# Patient Record
Sex: Female | Born: 1975 | Race: White | Hispanic: No | Marital: Married | Smoking: Never smoker
Health system: Southern US, Community
[De-identification: ages and names within clinical notes are randomized; demographics above are authoritative.]

## PROBLEM LIST (undated history)

## (undated) ENCOUNTER — Inpatient Hospital Stay (HOSPITAL_COMMUNITY): Payer: Self-pay

## (undated) DIAGNOSIS — T7840XA Allergy, unspecified, initial encounter: Secondary | ICD-10-CM

## (undated) DIAGNOSIS — Z880 Allergy status to penicillin: Secondary | ICD-10-CM

## (undated) DIAGNOSIS — E785 Hyperlipidemia, unspecified: Secondary | ICD-10-CM

## (undated) DIAGNOSIS — D649 Anemia, unspecified: Secondary | ICD-10-CM

## (undated) DIAGNOSIS — M6289 Other specified disorders of muscle: Secondary | ICD-10-CM

## (undated) DIAGNOSIS — Z3169 Encounter for other general counseling and advice on procreation: Secondary | ICD-10-CM

## (undated) DIAGNOSIS — Z87898 Personal history of other specified conditions: Secondary | ICD-10-CM

## (undated) HISTORY — DX: Anemia, unspecified: D64.9

## (undated) HISTORY — DX: Personal history of other specified conditions: Z87.898

## (undated) HISTORY — DX: Allergy, unspecified, initial encounter: T78.40XA

## (undated) HISTORY — DX: Encounter for other general counseling and advice on procreation: Z31.69

## (undated) HISTORY — PX: TYMPANOSTOMY TUBE PLACEMENT: SHX32

## (undated) HISTORY — DX: Other specified disorders of muscle: M62.89

## (undated) HISTORY — DX: Allergy status to penicillin: Z88.0

## (undated) HISTORY — DX: Hyperlipidemia, unspecified: E78.5

## (undated) HISTORY — PX: SPINE SURGERY: SHX786

## (undated) HISTORY — PX: WISDOM TOOTH EXTRACTION: SHX21

---

## 2009-08-14 ENCOUNTER — Encounter: Payer: Self-pay | Admitting: Internal Medicine

## 2009-08-14 LAB — CONVERTED CEMR LAB: Pap Smear: NORMAL

## 2009-11-23 ENCOUNTER — Encounter: Admission: RE | Admit: 2009-11-23 | Discharge: 2009-11-23 | Payer: Self-pay | Admitting: Obstetrics and Gynecology

## 2010-03-17 ENCOUNTER — Inpatient Hospital Stay (HOSPITAL_COMMUNITY): Admission: AD | Admit: 2010-03-17 | Payer: Self-pay | Source: Home / Self Care | Admitting: Obstetrics and Gynecology

## 2010-03-25 ENCOUNTER — Inpatient Hospital Stay (HOSPITAL_COMMUNITY)
Admission: AD | Admit: 2010-03-25 | Discharge: 2010-03-29 | DRG: 372 | Disposition: A | Discharge status: Home / Self Care | Payer: BC Managed Care – PPO | Source: Ambulatory Visit | Attending: Obstetrics and Gynecology | Admitting: Obstetrics and Gynecology

## 2010-03-25 DIAGNOSIS — O9902 Anemia complicating childbirth: Secondary | ICD-10-CM | POA: Diagnosis not present

## 2010-03-25 DIAGNOSIS — D696 Thrombocytopenia, unspecified: Secondary | ICD-10-CM | POA: Diagnosis not present

## 2010-03-25 DIAGNOSIS — D689 Coagulation defect, unspecified: Secondary | ICD-10-CM | POA: Diagnosis not present

## 2010-03-25 DIAGNOSIS — D649 Anemia, unspecified: Secondary | ICD-10-CM | POA: Diagnosis not present

## 2010-03-25 LAB — CBC
HCT: 40.8 % (ref 36.0–46.0)
Hemoglobin: 13.8 g/dL (ref 12.0–15.0)
MCH: 30.5 pg (ref 26.0–34.0)
MCHC: 33.8 g/dL (ref 30.0–36.0)
MCV: 90.3 fL (ref 78.0–100.0)
Platelets: 113 10*3/uL — ABNORMAL LOW (ref 150–400)
RBC: 4.52 MIL/uL (ref 3.87–5.11)
RDW: 13.3 % (ref 11.5–15.5)
WBC: 8.4 10*3/uL (ref 4.0–10.5)

## 2010-03-26 LAB — CBC
HCT: 42.4 % (ref 36.0–46.0)
Hemoglobin: 14.4 g/dL (ref 12.0–15.0)
MCH: 30.8 pg (ref 26.0–34.0)
MCHC: 34 g/dL (ref 30.0–36.0)
Platelets: 114 10*3/uL — ABNORMAL LOW (ref 150–400)
RBC: 4.67 MIL/uL (ref 3.87–5.11)
RDW: 13.5 % (ref 11.5–15.5)
WBC: 11.9 10*3/uL — ABNORMAL HIGH (ref 4.0–10.5)

## 2010-03-27 LAB — CBC
HCT: 28.8 % — ABNORMAL LOW (ref 36.0–46.0)
Hemoglobin: 9.8 g/dL — ABNORMAL LOW (ref 12.0–15.0)
MCH: 30.5 pg (ref 26.0–34.0)
MCHC: 34 g/dL (ref 30.0–36.0)
MCV: 89.7 fL (ref 78.0–100.0)
Platelets: 64 10*3/uL — ABNORMAL LOW (ref 150–400)
RDW: 13.5 % (ref 11.5–15.5)

## 2010-03-28 LAB — CBC
HCT: 23.6 % — ABNORMAL LOW (ref 36.0–46.0)
HCT: 23.8 % — ABNORMAL LOW (ref 36.0–46.0)
Hemoglobin: 7.8 g/dL — ABNORMAL LOW (ref 12.0–15.0)
Hemoglobin: 8 g/dL — ABNORMAL LOW (ref 12.0–15.0)
MCH: 30.2 pg (ref 26.0–34.0)
MCH: 30.7 pg (ref 26.0–34.0)
MCHC: 33.1 g/dL (ref 30.0–36.0)
MCHC: 33.6 g/dL (ref 30.0–36.0)
MCV: 91.5 fL (ref 78.0–100.0)
MCV: 91.2 fL (ref 78.0–100.0)
Platelets: 59 10*3/uL — ABNORMAL LOW (ref 150–400)
RBC: 2.61 MIL/uL — ABNORMAL LOW (ref 3.87–5.11)
RDW: 13.8 % (ref 11.5–15.5)
RDW: 13.7 % (ref 11.5–15.5)

## 2010-03-28 LAB — URIC ACID: Uric Acid, Serum: 3.9 mg/dL (ref 2.4–7.0)

## 2010-03-28 LAB — COMPREHENSIVE METABOLIC PANEL
ALT: 15 U/L (ref 0–35)
AST: 21 U/L (ref 0–37)
Alkaline Phosphatase: 74 U/L (ref 39–117)
Calcium: 8.1 mg/dL — ABNORMAL LOW (ref 8.4–10.5)
Chloride: 107 mEq/L (ref 96–112)
Creatinine, Ser: 0.66 mg/dL (ref 0.4–1.2)
GFR calc Af Amer: >60 mL/min (ref >60)
GFR calc non Af Amer: >60 mL/min (ref >60)
Glucose, Bld: 86 mg/dL (ref 70–99)
Potassium: 4.1 mEq/L (ref 3.5–5.1)
Sodium: 138 mEq/L (ref 135–145)
Total Bilirubin: 0.1 mg/dL — ABNORMAL LOW (ref 0.3–1.2)
Total Protein: 4.1 g/dL — ABNORMAL LOW (ref 6.0–8.3)

## 2010-03-28 LAB — APTT: aPTT: 26 seconds (ref 24–37)

## 2010-03-28 LAB — PROTIME-INR
INR: 0.92 (ref 0.00–1.49)
Prothrombin Time: 12.6 seconds (ref 11.6–15.2)

## 2010-03-28 LAB — LACTATE DEHYDROGENASE: LDH: 164 U/L (ref 94–250)

## 2010-03-29 LAB — CBC
Hemoglobin: 8 g/dL — ABNORMAL LOW (ref 12.0–15.0)
MCH: 30.7 pg (ref 26.0–34.0)
MCV: 91.6 fL (ref 78.0–100.0)
Platelets: 85 10*3/uL — ABNORMAL LOW (ref 150–400)
RBC: 2.61 MIL/uL — ABNORMAL LOW (ref 3.87–5.11)
RDW: 13.9 % (ref 11.5–15.5)
WBC: 7.5 10*3/uL (ref 4.0–10.5)

## 2010-04-16 ENCOUNTER — Other Ambulatory Visit: Payer: Self-pay | Admitting: Internal Medicine

## 2010-04-16 ENCOUNTER — Encounter: Payer: Self-pay | Admitting: Internal Medicine

## 2010-04-16 ENCOUNTER — Other Ambulatory Visit: Payer: BC Managed Care – PPO

## 2010-04-16 ENCOUNTER — Ambulatory Visit (INDEPENDENT_AMBULATORY_CARE_PROVIDER_SITE_OTHER): Payer: BC Managed Care – PPO | Admitting: Internal Medicine

## 2010-04-16 DIAGNOSIS — Z0000 Encounter for general adult medical examination without abnormal findings: Secondary | ICD-10-CM

## 2010-04-16 DIAGNOSIS — Z23 Encounter for immunization: Secondary | ICD-10-CM

## 2010-04-16 DIAGNOSIS — E785 Hyperlipidemia, unspecified: Secondary | ICD-10-CM

## 2010-04-16 LAB — CBC WITH DIFFERENTIAL/PLATELET
Basophils Absolute: 0.1 10*3/uL (ref 0.0–0.1)
Basophils Relative: 1.5 % (ref 0.0–3.0)
Eosinophils Absolute: 0.2 10*3/uL (ref 0.0–0.7)
HCT: 37.4 % (ref 36.0–46.0)
Hemoglobin: 12.7 g/dL (ref 12.0–15.0)
Lymphocytes Relative: 19.7 % (ref 12.0–46.0)
MCHC: 33.9 g/dL (ref 30.0–36.0)
MCV: 92.6 fl (ref 78.0–100.0)
Monocytes Absolute: 0.3 10*3/uL (ref 0.1–1.0)
Monocytes Relative: 5.9 % (ref 3.0–12.0)
Neutro Abs: 3.2 10*3/uL (ref 1.4–7.7)
Neutrophils Relative %: 69.1 % (ref 43.0–77.0)
RBC: 4.04 Mil/uL (ref 3.87–5.11)

## 2010-04-16 LAB — HEPATIC FUNCTION PANEL
ALT: 18 U/L (ref 0–35)
AST: 21 U/L (ref 0–37)
Albumin: 3.8 g/dL (ref 3.5–5.2)
Alkaline Phosphatase: 88 U/L (ref 39–117)
Bilirubin, Direct: 0.1 mg/dL (ref 0.0–0.3)
Total Bilirubin: 0.6 mg/dL (ref 0.3–1.2)
Total Protein: 6.4 g/dL (ref 6.0–8.3)

## 2010-04-16 LAB — BASIC METABOLIC PANEL
CO2: 28 mEq/L (ref 19–32)
Calcium: 9.5 mg/dL (ref 8.4–10.5)
Chloride: 107 mEq/L (ref 96–112)
Creatinine, Ser: 1 mg/dL (ref 0.4–1.2)
GFR: 71.46 mL/min (ref >60.00)
Glucose, Bld: 81 mg/dL (ref 70–99)
Potassium: 4.3 mEq/L (ref 3.5–5.1)
Sodium: 140 mEq/L (ref 135–145)

## 2010-04-16 LAB — URINALYSIS, ROUTINE W REFLEX MICROSCOPIC
Bilirubin Urine: NEGATIVE
Ketones, ur: NEGATIVE
Nitrite: NEGATIVE
Specific Gravity, Urine: >=1.03 (ref 1.000–1.030)
Total Protein, Urine: NEGATIVE
Urine Glucose: NEGATIVE
pH: 5 (ref 5.0–8.0)

## 2010-04-16 LAB — LIPID PANEL
Cholesterol: 217 mg/dL — ABNORMAL HIGH (ref 0–200)
HDL: 52.6 mg/dL (ref >39.00)
Triglycerides: 101 mg/dL (ref 0.0–149.0)

## 2010-04-16 LAB — LDL CHOLESTEROL, DIRECT: Direct LDL: 151.1 mg/dL

## 2010-04-16 LAB — TSH: TSH: 0.7 u[IU]/mL (ref 0.35–5.50)

## 2010-04-22 NOTE — Assessment & Plan Note (Signed)
Summary: NEW [employer] PT--PKG--#--STC   Vital Signs:  Patient profile:   35 year old female Height:      66 inches Weight:      150 pounds BMI:     24.30 O2 Sat:      98 % on Room air Temp:     98.4 degrees F oral Pulse rate:   62 / minute BP sitting:   100 / 62  (left arm) Cuff size:   regular  Vitals Entered By: Zella Ball Ewing CMA Duncan Dull) (April 16, 2010 9:33 AM)  O2 Flow:  Room air  Preventive Care Screening  Last Tetanus Booster:    Date:  04/15/1994    Results:  Historical   Pap Smear:    Date:  08/14/2009    Results:  normal   CC: New Patient/RE   CC:  New Patient/RE.  History of Present Illness: here to establish with wellness, 3 wks postpartum with recent vaginal delivery complicated by significant anemia with f/u per GYN with anemia and plt improved, iron improved,  Pt denies CP, worsening sob, doe, wheezing, orthopnea, pnd, worsening LE edema, palps, dizziness or syncope  Pt denies new neuro symptoms such as headache, facial or extremity weakness Pt denies polydipsia, polyuria.  Overall good compliance with meds, trying to follow low chol diet, wt stable, little excercise however  No fever, wt loss, night sweats, loss of appetite or other constitutional symptoms  Overall good compliance with meds, and good tolerability.  Denies worsening depressive symptoms, suicidal ideation, or panic.  Pt states good ability with ADL's, low fall risk, home safety reviewed and adequate, no significant change in hearing or vision, trying to follow lower chol diet, and occasionally active only with regular excercise.  No new complaints, needs form filled out for work to  certify she was examined and VS/labs today  Preventive Screening-Counseling & Management  Alcohol-Tobacco     Smoking Status: never      Drug Use:  no.    Problems Prior to Update: 1)  Preventive Health Care  (ICD-V70.0)  Medications Prior to Update: 1)  None  Current Medications (verified): 1)  Prenatal Vitamins  0.8 Mg Tabs (Prenatal Multivit-Min-Fe-Fa) .Marland Kitchen.. 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         all otherwise negative per pt -    Physical Exam  General:  alert and overweight-appearing.   Head:  normocephalic and  atraumatic.   Eyes:  vision grossly intact, pupils equal, and pupils round.   Ears:  R ear normal and L ear normal.   Nose:  no external deformity and no nasal discharge.   Mouth:  no gingival abnormalities and pharynx pink and moist.   Neck:  supple and no masses.   Lungs:  normal respiratory effort and normal breath sounds.   Heart:  normal rate and regular rhythm.   Abdomen:  soft, non-tender, and normal bowel sounds.   Msk:   no joint tenderness and no joint swelling.   Extremities:  no edema, no erythema  Neurologic:  cranial nerves II-XII intact and strength normal in all extremities.   Skin:  color normal and no rashes.   Psych:  not anxious appearing and not depressed appearing.     Impression & Recommendations:  Problem # 1:  Preventive Health Care (ICD-V70.0) Overall doing well, age appropriate education and counseling updated, referral for preventive services and immunizations addressed, dietary counseling and smoking status adressed , most recent labs reviewed I have personally reviewed and have noted 1.The patient's medical and social history 2.Their use of alcohol, tobacco or illicit drugs 3.Their current medications and supplements 4. Functional ability including ADL's, fall risk, home safety risk, hearing & visual impairment  5.Diet and physical activities 6.Evidence for depression or mood disorders The patients weight, height, BMI  have been recorded in the chart I have made referrals, counseling and provided education to the patient based review of the above  Orders: TLB-BMP (Basic Metabolic Panel-BMET) (80048-METABOL) TLB-CBC Platelet - w/Differential (85025-CBCD) TLB-Hepatic/Liver Function Pnl (80076-HEPATIC) TLB-Lipid Panel (80061-LIPID) TLB-TSH (Thyroid Stimulating Hormone) (84443-TSH) TLB-Udip ONLY (81003-UDIP)  Complete Medication List: 1)  Prenatal Vitamins 0.8 Mg Tabs (Prenatal multivit-min-fe-fa) .Marland Kitchen.. 1 by mouth once daily  Other Orders: Tdap => 74yrs IM (16109) Admin 1st Vaccine (60454)  Patient Instructions: 1)  you had the tetanus shot today 2)  Please go to the Lab in the basement for your blood and/or urine tests today 3)  Please call the number on the [facility] Card for results of your testing 4)  Your form will be filled out and faxed 5)  Please schedule a follow-up appointment in 1 year or sooner if needed   Orders Added: 1)  Tdap => 15yrs IM [90715] 2)  Admin 1st  Vaccine [90471] 3)  TLB-BMP (Basic Metabolic Panel-BMET) [80048-METABOL] 4)  TLB-CBC Platelet - w/Differential [85025-CBCD] 5)  TLB-Hepatic/Liver Function Pnl [80076-HEPATIC] 6)  TLB-Lipid Panel [80061-LIPID] 7)  TLB-TSH (Thyroid Stimulating Hormone) [84443-TSH] 8)  TLB-Udip ONLY [81003-UDIP] 9)  New Patient 18-39 years [99385]   Immunizations Administered:  Tetanus Vaccine:    Vaccine Type: Tdap    Site: left deltoid    Mfr: Evans    Dose: 0.5 ml    Route: IM    Given by: Zella Ball Ewing CMA (AAMA)    Exp. Date: 03/18/2011    Lot #: [id number]    VIS given: 01/02/08 version given April 16, 2010.   Immunizations Administered:  Tetanus Vaccine:    Vaccine Type: Tdap    Site: left deltoid    Mfr: Evans    Dose: 0.5 ml    Route: IM    Given by: Zella Ball Ewing CMA (AAMA)    Exp. Date: 03/18/2011    Lot #: [id number]    VIS given: 01/02/08 version given April 16, 2010.

## 2010-04-27 NOTE — Letter (Signed)
Summary: Health Screening / [facility]  Health Screening / [facility]   Imported By: Lennie Odor 04/19/2010 11:39:41  _____________________________________________________________________  External Attachment:    Type:   Image     Comment:   External Document

## 2010-05-04 DIAGNOSIS — M6289 Other specified disorders of muscle: Secondary | ICD-10-CM

## 2010-05-04 HISTORY — DX: Other specified disorders of muscle: M62.89

## 2011-06-03 ENCOUNTER — Encounter: Payer: Self-pay | Admitting: Internal Medicine

## 2011-06-03 ENCOUNTER — Telehealth: Payer: Self-pay | Admitting: *Deleted

## 2011-06-03 ENCOUNTER — Other Ambulatory Visit (INDEPENDENT_AMBULATORY_CARE_PROVIDER_SITE_OTHER): Payer: BC Managed Care – PPO

## 2011-06-03 DIAGNOSIS — Z0000 Encounter for general adult medical examination without abnormal findings: Secondary | ICD-10-CM

## 2011-06-03 DIAGNOSIS — E785 Hyperlipidemia, unspecified: Secondary | ICD-10-CM

## 2011-06-03 HISTORY — DX: Hyperlipidemia, unspecified: E78.5

## 2011-06-03 LAB — HEPATIC FUNCTION PANEL
ALT: 15 U/L (ref 0–35)
AST: 19 U/L (ref 0–37)
Albumin: 4.3 g/dL (ref 3.5–5.2)
Alkaline Phosphatase: 67 U/L (ref 39–117)
Bilirubin, Direct: 0.1 mg/dL (ref 0.0–0.3)
Total Bilirubin: 0.9 mg/dL (ref 0.3–1.2)
Total Protein: 7.2 g/dL (ref 6.0–8.3)

## 2011-06-03 LAB — BASIC METABOLIC PANEL
BUN: 16 mg/dL (ref 6–23)
CO2: 28 mEq/L (ref 19–32)
Calcium: 9.2 mg/dL (ref 8.4–10.5)
Chloride: 103 mEq/L (ref 96–112)
GFR: 99.34 mL/min (ref >60.00)
Glucose, Bld: 99 mg/dL (ref 70–99)
Potassium: 4.3 mEq/L (ref 3.5–5.1)
Sodium: 138 mEq/L (ref 135–145)

## 2011-06-03 LAB — URINALYSIS, ROUTINE W REFLEX MICROSCOPIC
Bilirubin Urine: NEGATIVE
Ketones, ur: NEGATIVE
Nitrite: NEGATIVE
Specific Gravity, Urine: <=1.005 (ref 1.000–1.030)
Total Protein, Urine: NEGATIVE
Urine Glucose: NEGATIVE
Urobilinogen, UA: 0.2 (ref 0.0–1.0)
pH: 6.5 (ref 5.0–8.0)

## 2011-06-03 LAB — CBC WITH DIFFERENTIAL/PLATELET
Basophils Absolute: 0 10*3/uL (ref 0.0–0.1)
Eosinophils Absolute: 0.1 10*3/uL (ref 0.0–0.7)
Eosinophils Relative: 1 % (ref 0.0–5.0)
HCT: 43.3 % (ref 36.0–46.0)
Hemoglobin: 14.4 g/dL (ref 12.0–15.0)
Lymphocytes Relative: 18.3 % (ref 12.0–46.0)
Lymphs Abs: 1.1 10*3/uL (ref 0.7–4.0)
Monocytes Relative: 7.9 % (ref 3.0–12.0)
Neutro Abs: 4.5 10*3/uL (ref 1.4–7.7)
Neutrophils Relative %: 72.3 % (ref 43.0–77.0)
Platelets: 217 10*3/uL (ref 150.0–400.0)
RBC: 4.68 Mil/uL (ref 3.87–5.11)
RDW: 13 % (ref 11.5–14.6)
WBC: 6.3 10*3/uL (ref 4.5–10.5)

## 2011-06-03 LAB — LIPID PANEL
HDL: 53.4 mg/dL (ref >39.00)
LDL Cholesterol: 117 mg/dL — ABNORMAL HIGH (ref 0–99)
Total CHOL/HDL Ratio: 3
Triglycerides: 33 mg/dL (ref 0.0–149.0)
VLDL: 6.6 mg/dL (ref 0.0–40.0)

## 2011-06-03 LAB — TSH: TSH: 1.11 u[IU]/mL (ref 0.35–5.50)

## 2011-06-03 NOTE — Telephone Encounter (Signed)
Received  All from anita in the lab. Pt is in lab no orders in computer. Pt is coming in for cpx 06/08/11. entering cpx labs... 06/03/11@10 :18am/LMB

## 2011-06-07 ENCOUNTER — Encounter: Payer: Self-pay | Admitting: Internal Medicine

## 2011-06-07 ENCOUNTER — Ambulatory Visit (INDEPENDENT_AMBULATORY_CARE_PROVIDER_SITE_OTHER): Payer: BC Managed Care – PPO | Admitting: Internal Medicine

## 2011-06-07 VITALS — BP 100/80 | HR 64 | Temp 97.7°F | Ht 66.0 in | Wt 117.1 lb

## 2011-06-07 DIAGNOSIS — D649 Anemia, unspecified: Secondary | ICD-10-CM

## 2011-06-07 DIAGNOSIS — Z0000 Encounter for general adult medical examination without abnormal findings: Secondary | ICD-10-CM

## 2011-06-07 HISTORY — DX: Anemia, unspecified: D64.9

## 2011-06-07 NOTE — Patient Instructions (Signed)
Continue all other medications as before You are given the form filled out today Please continue your good health habits with regular exercise, lower cholesterol diet, weight control Please return in 1 year for your yearly visit, or sooner if needed, with Lab testing done 3-5 days before

## 2011-06-07 NOTE — Progress Notes (Signed)
Subjective:    Patient ID: Carolyn Hancock, female    DOB: 1975-04-12, 36 y.o.   MRN: [id number]  HPI  Here for wellness and f/u;  Overall doing ok;  Pt denies CP, worsening SOB, DOE, wheezing, orthopnea, PND, worsening LE edema, palpitations, dizziness or syncope.  Pt denies neurological change such as new Headache, facial or extremity weakness.  Pt denies polydipsia, polyuria, or low sugar symptoms. Pt states overall good compliance with treatment and medications, good tolerability, and trying to follow lower cholesterol diet.  Pt denies worsening depressive symptoms, suicidal ideation or panic. No fever, wt loss, night sweats, loss of appetite, or other constitutional symptoms.  Pt states good ability with ADL's, low fall risk, home safety reviewed and adequate, no significant changes in hearing or vision, and occasionally active with exercise.  Needs form filled out for work to get a lower cost insurance premium. No acute complaints.  Did have severe anemia with childbirth last yr, but now resolved. Past Medical History  Diagnosis Date  . Hyperlipidemia 06/03/2011  . Anemia, unspecified 06/07/2011   Past Surgical History  Procedure Date  . Tympanostomy tube placement     reports that she has never smoked. She has never used smokeless tobacco. She reports that she does not drink alcohol or use illicit drugs. family history includes Heart failure in her paternal grandmother; Hyperlipidemia in her father and mother; and Hypertension in her father and mother. No Known Allergies No current outpatient prescriptions on file prior to visit.   Review of Systems Review of Systems  Constitutional: Negative for diaphoresis, activity change, appetite change and unexpected weight change.  HENT: Negative for hearing loss, ear pain, facial swelling, mouth sores and neck stiffness.   Eyes: Negative for pain, redness and visual disturbance.  Respiratory: Negative for shortness of breath and wheezing.     Cardiovascular: Negative for chest pain and palpitations.  Gastrointestinal: Negative for diarrhea, blood in stool, abdominal distention and rectal pain.  Genitourinary: Negative for hematuria, flank pain and decreased urine volume.  Musculoskeletal: Negative for myalgias and joint swelling.  Skin: Negative for color change and wound.  Neurological: Negative for syncope and numbness.  Hematological: Negative for adenopathy.  Psychiatric/Behavioral: Negative for hallucinations, self-injury, decreased concentration and agitation.     Objective:   Physical Exam BP 100/80  Pulse 64  Temp(Src) 97.7 F (36.5 C) (Oral)  Ht 5\' 6"  (1.676 m)  Wt 117 lb 2 oz (53.128 kg)  BMI 18.90 kg/m2  SpO2 96%  LMP 05/30/2011 Physical Exam  VS noted Constitutional: Pt is oriented to person, place, and time. Appears well-developed and well-nourished.  HENT:  Head: Normocephalic and atraumatic.  Right Ear: External ear normal.  Left Ear: External ear normal.  Nose: Nose normal.  Mouth/Throat: Oropharynx is clear and moist.  Eyes: Conjunctivae and EOM are normal. Pupils are equal, round, and reactive to light.  Neck: Normal range of motion. Neck supple. No JVD present. No tracheal deviation present.  Cardiovascular: Normal rate, regular rhythm, normal heart sounds and intact distal pulses.   Pulmonary/Chest: Effort normal and breath sounds normal.  Abdominal: Soft. Bowel sounds are normal. There is no tenderness.  Musculoskeletal: Normal range of motion. Exhibits no edema.  Lymphadenopathy:  Has no cervical adenopathy.  Neurological: Pt is alert and oriented to person, place, and time. Pt has normal reflexes. No cranial nerve deficit.  Skin: Skin is warm and dry. No rash noted.  Psychiatric:  Has  normal mood and affect. Behavior is normal.  Assessment & Plan:

## 2011-06-07 NOTE — Assessment & Plan Note (Addendum)
Overall doing well, age appropriate education and counseling updated, referrals for preventative services and immunizations addressed, dietary and smoking counseling addressed, most recent labs and ECG reviewed.  I have personally reviewed and have noted: 1) the patient's medical and social history 2) The pt's use of alcohol, tobacco, and illicit drugs 3) The patient's current medications and supplements 4) Functional ability including ADL's, fall risk, home safety risk, hearing and visual impairment 5) Diet and physical activities 6) Evidence for depression or mood disorder 7) The patient's height, weight, and BMI have been recorded in the chart I have made referrals, and provided counseling and education based on review of the above Overall excellent health, form filled out

## 2011-07-22 ENCOUNTER — Ambulatory Visit (INDEPENDENT_AMBULATORY_CARE_PROVIDER_SITE_OTHER): Payer: BC Managed Care – PPO

## 2011-07-22 VITALS — BP 102/56 | Ht 65.6 in | Wt 119.0 lb

## 2011-07-22 DIAGNOSIS — Z87898 Personal history of other specified conditions: Secondary | ICD-10-CM

## 2011-07-22 DIAGNOSIS — T360X5A Adverse effect of penicillins, initial encounter: Secondary | ICD-10-CM

## 2011-07-22 DIAGNOSIS — Z8619 Personal history of other infectious and parasitic diseases: Secondary | ICD-10-CM

## 2011-07-22 DIAGNOSIS — Z880 Allergy status to penicillin: Secondary | ICD-10-CM

## 2011-07-22 DIAGNOSIS — Z888 Allergy status to other drugs, medicaments and biological substances status: Secondary | ICD-10-CM

## 2011-07-22 DIAGNOSIS — N8184 Pelvic muscle wasting: Secondary | ICD-10-CM

## 2011-07-22 DIAGNOSIS — Z3169 Encounter for other general counseling and advice on procreation: Secondary | ICD-10-CM

## 2011-07-22 DIAGNOSIS — M6289 Other specified disorders of muscle: Secondary | ICD-10-CM

## 2011-07-22 DIAGNOSIS — Z124 Encounter for screening for malignant neoplasm of cervix: Secondary | ICD-10-CM

## 2011-07-22 DIAGNOSIS — Z8742 Personal history of other diseases of the female genital tract: Secondary | ICD-10-CM

## 2011-07-22 HISTORY — DX: Encounter for other general counseling and advice on procreation: Z31.69

## 2011-07-22 MED ORDER — CITRANATAL ASSURE 35-1 & 300 MG PO MISC: 1.0000 | Freq: Every day | ORAL | Status: AC

## 2011-07-22 NOTE — Progress Notes (Signed)
The patient reports:normal menses, no abnormal bleeding, pelvic pain or discharge  Contraception:condoms  Last mammogram: patient has never had a mammogram Last pap: was normal 2012  GC/Chlamydia cultures offered: declined HIV/RPR/HbsAg offered:  declined HSV 1 and 2 glycoprotein offered: declined  Menstrual cycle regular and monthly: Yes Menstrual flow normal: Yes  Urinary symptoms: none Normal bowel movements: Yes Reports abuse at home: No:

## 2011-07-26 LAB — PAP IG W/ RFLX HPV ASCU

## 2011-07-28 DIAGNOSIS — Z87898 Personal history of other specified conditions: Secondary | ICD-10-CM

## 2011-07-28 DIAGNOSIS — Z8619 Personal history of other infectious and parasitic diseases: Secondary | ICD-10-CM | POA: Insufficient documentation

## 2011-07-28 DIAGNOSIS — Z880 Allergy status to penicillin: Secondary | ICD-10-CM

## 2011-07-28 HISTORY — DX: Allergy status to penicillin: Z88.0

## 2011-07-28 HISTORY — DX: Personal history of other specified conditions: Z87.898

## 2011-07-28 NOTE — Progress Notes (Signed)
..   Subjective:    Carolyn Hancock is a 36 y.o. MW female, P1001 s/p SVD 03/26/10., who presents for an annual exam.    Works from home; has a Social worker.  Planning conception last summer/fall. No recent issues w/ Urin. IC; last visit at [facility] almost 37yr ago, and pt reports doing well. History   Social History  . Marital Status: Unknown    Spouse Name: Alycia Rossetti    Number of Children: 1  . Years of Education: 17   Occupational History  .     Social History Main Topics  . Smoking status: Never Smoker   . Smokeless tobacco: Never Used  . Alcohol Use: No  . Drug Use: No  . Sexually Active: Yes    Birth Control/ Protection: Condom   Other Topics Concern  . None   Social History Narrative  . None  The patient reports:normal menses, no abnormal bleeding, pelvic pain or discharge Contraception:condoms  Last mammogram: patient has never had a mammogram Last pap: was normal 2012  GC/Chlamydia cultures offered: declined  HIV/RPR/HbsAg offered: declined  HSV 1 and 2 glycoprotein offered: declined  Menstrual cycle regular and monthly: Yes; has had 3-4 normal cycles s/p lactational amenorrhea. Menstrual flow normal: Yes  Urinary symptoms: none  Normal bowel movements: Yes  Reports abuse at home: No:    Menstrual cycle:   LMP: Patient's last menstrual period was 06/27/2011.           Cycle: monthly  The following portions of the patient's history were reviewed and updated as appropriate: allergies, current medications, past family history, past medical history, past social history, past surgical history and problem list.  Review of Systems Pertinent items are noted in HPI. Breast:Negative for breast lump,nipple discharge or nipple retraction Gastrointestinal: Negative for abdominal pain, change in bowel habits or rectal bleeding Urinary:negative   Objective:    BP 102/56  Ht 5' 5.6" (1.666 m)  Wt 119 lb (53.978 kg)  BMI 19.44 kg/m2  LMP 06/27/2011    Weight:  Wt  Readings from Last 1 Encounters:  07/22/11 119 lb (53.978 kg)          BMI: Body mass index is 19.44 kg/(m^2).  General Appearance: Alert, appropriate appearance for age. No acute distress HEENT: Grossly normal Neck / Thyroid: Supple, no masses, nodes or enlargement Lungs: clear to auscultation bilaterally Back: No CVA tenderness Breast Exam: No dimpling, nipple retraction or discharge. No masses or nodes. and No masses or nodes.No dimpling, nipple retraction or discharge. Cardiovascular: Regular rate and rhythm. S1, S2, no murmur Gastrointestinal: Soft, non-tender, no masses or organomegaly Pelvic Exam: Vulva and vagina appear normal. Bimanual exam reveals normal uterus and adnexa. 4/5 Kegel Rectovaginal: not indicated Lymphatic Exam: Non-palpable nodes in neck, clavicular, axillary, or inguinal regions Skin: no rash or abnormalities Neurologic: Normal gait and speech, no tremor  Psychiatric: Alert and oriented, appropriate affect.   Wet Prep:not applicable Urinalysis:not applicable UPT: Not done   Assessment:    Normal gyn exam Preconception counseling; AMA; resolved PFD.    Plan:   Rec'd daily vit, sunscreen, Kegels, healthy lifestyle. pap smear return annually or prn STD screening: declined Contraception:condoms      Elden Brucato HCNM

## 2011-10-28 ENCOUNTER — Inpatient Hospital Stay (HOSPITAL_COMMUNITY)
Admission: AD | Admit: 2011-10-28 | Discharge: 2011-10-28 | Disposition: A | Discharge status: Home / Self Care | Payer: BC Managed Care – PPO | Source: Ambulatory Visit | Attending: Obstetrics and Gynecology | Admitting: Obstetrics and Gynecology

## 2011-10-28 ENCOUNTER — Other Ambulatory Visit: Payer: Self-pay | Admitting: Obstetrics and Gynecology

## 2011-10-28 ENCOUNTER — Telehealth: Payer: Self-pay

## 2011-10-28 ENCOUNTER — Encounter (HOSPITAL_COMMUNITY): Payer: Self-pay | Admitting: *Deleted

## 2011-10-28 ENCOUNTER — Inpatient Hospital Stay (HOSPITAL_COMMUNITY): Payer: BC Managed Care – PPO

## 2011-10-28 DIAGNOSIS — O200 Threatened abortion: Secondary | ICD-10-CM

## 2011-10-28 DIAGNOSIS — Z862 Personal history of diseases of the blood and blood-forming organs and certain disorders involving the immune mechanism: Secondary | ICD-10-CM | POA: Insufficient documentation

## 2011-10-28 DIAGNOSIS — N939 Abnormal uterine and vaginal bleeding, unspecified: Secondary | ICD-10-CM | POA: Diagnosis present

## 2011-10-28 DIAGNOSIS — O209 Hemorrhage in early pregnancy, unspecified: Secondary | ICD-10-CM | POA: Insufficient documentation

## 2011-10-28 LAB — CBC
Hemoglobin: 14.8 g/dL (ref 12.0–15.0)
MCH: 30.1 pg (ref 26.0–34.0)
MCV: 89.4 fL (ref 78.0–100.0)
Platelets: 230 10*3/uL (ref 150–400)
RBC: 4.91 MIL/uL (ref 3.87–5.11)
WBC: 7.5 10*3/uL (ref 4.0–10.5)

## 2011-10-28 LAB — HCG, QUANTITATIVE, PREGNANCY: hCG, Beta Chain, Quant, S: 16 m[IU]/mL — ABNORMAL HIGH (ref <5)

## 2011-10-28 NOTE — MAU Note (Signed)
Pt noticed vag bleeding approx 1530.  She wiped and put a pad on had a 50 cent piece size bleeding in pad over the past hour.  Denies any cramping, or dysuria.

## 2011-10-28 NOTE — MAU Provider Note (Signed)
History   36 yo G2P1001 at 5 5/7 weeks by LMP presented c/o onset of BRB around 3:30pm today.  Denies pain, no recent IC.  Had early bleeding with 1st pregnancy.  O+ type by previous records.  Patient Active Problem List  Diagnosis  . Hyperlipidemia  . Anemia, unspecified  . Pelvic floor dysfunction  . History of abnormal Pap smear  . Hx of condyloma acuminatum  . Penicillin allergy  . Hx of thrombocytopenia in previous pregnancy  . Vaginal bleeding in 1st trimester  Had early previa in last pregnancy.   OB History    Grav Para Term Preterm Abortions TAB SAB Ect Mult Living   2 1 1       1     SVB 2/12  Past Medical History  Diagnosis Date  . Hyperlipidemia 06/03/2011  . Anemia, unspecified 06/07/2011  . Pelvic floor dysfunction 05/04/2010    Resolved; 4/5 Kegel and no UIC at [facility]/pap on 67/13  . Pre-conception counseling 07/22/2011  . History of abnormal Pap smear 07/28/2011    1997; repeats normal since  . Penicillin allergy 07/28/2011    rash    Past Surgical History  Procedure Date  . Tympanostomy tube placement   . Wisdom tooth extraction     Family History  Problem Relation Age of Onset  . Hyperlipidemia Mother   . Hypertension Mother   . Hyperlipidemia Father   . Hypertension Father   . Heart failure Paternal Grandmother     History  Substance Use Topics  . Smoking status: Never Smoker   . Smokeless tobacco: Never Used  . Alcohol Use: No    Allergies:  Allergies  Allergen Reactions  . Penicillins Rash    Prescriptions prior to admission  Medication Sig Dispense Refill  . Prenat w/o A-[id number] (CITRANATAL ASSURE) 35-1 & 300 MG tablet Take 1 tablet by mouth daily.  60 tablet  6  . Probiotic Product (PROBIOTIC PO) Take 1 capsule by mouth every morning. [employer] brand probiotic         Physical Exam   Last menstrual period 09/20/2011.  Chest clear Heart RRR without mumur Abd soft, NT Pelvic--small amount dark blood in the vault, with blood at  cervical os.  No CMT, cervix closed, long Ext--WNL    ED Course  1st trimester bleeding O+type  Plan: CBC, QHCG GC, chlamydia Pelvic US   Tacarra Justo CNM, [place] 10/28/2011 5:09 PM   Addendum:  Results for orders placed during the hospital encounter of 10/28/11 (from the past 24 hour(s))  CBC     Status: Normal   Collection Time   10/28/11  4:41 PM      Component Value Range   WBC 7.5  4.0 - 10.5 K/uL   RBC 4.91  3.87 - 5.11 MIL/uL   Hemoglobin 14.8  12.0 - 15.0 g/dL   HCT 16.1  09.6 - 04.5 %   MCV 89.4  78.0 - 100.0 fL   MCH 30.1  26.0 - 34.0 pg   MCHC 33.7  30.0 - 36.0 g/dL   RDW 40.9  81.1 - 91.4 %   Platelets 230  150 - 400 K/uL  HCG, QUANTITATIVE, PREGNANCY     Status: Abnormal   Collection Time   10/28/11  4:41 PM      Component Value Range   hCG, Beta Chain, Quant, S 16 (*) <5 mIU/mL   Returned from [place]: No IUGS, YS, FP.  Differentials--early IUP, missed AB, ectopic.   Ovaries  and adenexa WNL--small CLC on left.  Reviewed findings with patient and husband. Plan repeat QHCG in approx 1 week at office (next Thursday best for patient). Lab notified to schedule patient. Will f/u prn prior to that time with any questions or concerns.  Nigel Bridgeman, CNM 6:10p 10/28/11

## 2011-10-28 NOTE — Telephone Encounter (Signed)
BONNIE/OB

## 2011-10-28 NOTE — Telephone Encounter (Signed)
Spoke with VL and advised pt to come in to MAU for evaluation. Pt's voice understanding. bt cma

## 2011-10-28 NOTE — Telephone Encounter (Signed)
Spoke with pt rgd call.pt stated she is 5 weeks with bleeding. This is pt's 2nd pregnancy ,0 history of miscarriages,  0 cramping pt stated she has been bleeding bright red blood. Advised pt would contact the mid-wife oncall and would call pt back pt's voice understanding.

## 2011-10-29 LAB — GC/CHLAMYDIA PROBE AMP, GENITAL: GC Probe Amp, Genital: NEGATIVE

## 2011-11-03 ENCOUNTER — Other Ambulatory Visit: Payer: BC Managed Care – PPO

## 2011-11-04 ENCOUNTER — Encounter: Payer: Self-pay | Admitting: Obstetrics and Gynecology

## 2011-11-04 ENCOUNTER — Telehealth: Payer: Self-pay | Admitting: Obstetrics and Gynecology

## 2011-11-04 NOTE — Telephone Encounter (Signed)
Tc to pt regarding msg below.  Pt rescheduled lab appt for Tuesday 11/08/11 @ 1000, pt voices understanding.

## 2011-11-04 NOTE — Telephone Encounter (Signed)
Message copied by Delon Sacramento on Fri Nov 04, 2011  8:51 AM ------      Message from: Cornelius Moras      Created: Fri Nov 04, 2011  6:14 AM      Regarding: Lab f/u       Patient was on schedule for repeat [facility] on Thursday, 9/19.  Doesn't look like she came.  Please call and see what her plans are for repeating--can be done when convenient for her.            Last QHCG 16....            VL

## 2011-11-08 ENCOUNTER — Other Ambulatory Visit: Payer: BC Managed Care – PPO

## 2011-11-08 DIAGNOSIS — O035 Genital tract and pelvic infection following complete or unspecified spontaneous abortion: Secondary | ICD-10-CM

## 2011-11-09 LAB — HCG, QUANTITATIVE, PREGNANCY: hCG, Beta Chain, Quant, S: <2 m[IU]/mL

## 2011-11-10 ENCOUNTER — Telehealth: Payer: Self-pay | Admitting: Obstetrics and Gynecology

## 2011-11-10 NOTE — Telephone Encounter (Signed)
Message copied by Mason Jim on Thu Nov 10, 2011  4:54 PM ------      Message from: Cornelius Moras      Created: Wed Nov 09, 2011  5:22 PM       No further QHCGs needed.      Please call patient and advise of results.

## 2012-03-31 ENCOUNTER — Other Ambulatory Visit: Payer: Self-pay

## 2012-09-10 ENCOUNTER — Telehealth: Payer: Self-pay

## 2012-09-10 DIAGNOSIS — Z0000 Encounter for general adult medical examination without abnormal findings: Secondary | ICD-10-CM

## 2012-09-10 NOTE — Telephone Encounter (Signed)
cpx labs entered  

## 2012-09-26 ENCOUNTER — Other Ambulatory Visit (INDEPENDENT_AMBULATORY_CARE_PROVIDER_SITE_OTHER): Payer: BC Managed Care – PPO

## 2012-09-26 DIAGNOSIS — Z0000 Encounter for general adult medical examination without abnormal findings: Secondary | ICD-10-CM

## 2012-09-26 LAB — CBC WITH DIFFERENTIAL/PLATELET
Basophils Absolute: 0 10*3/uL (ref 0.0–0.1)
Eosinophils Relative: 1.6 % (ref 0.0–5.0)
HCT: 46.4 % — ABNORMAL HIGH (ref 36.0–46.0)
Lymphocytes Relative: 22 % (ref 12.0–46.0)
Monocytes Relative: 5.2 % (ref 3.0–12.0)
Platelets: 173 10*3/uL (ref 150.0–400.0)
RDW: 12.7 % (ref 11.5–14.6)
WBC: 5.2 10*3/uL (ref 4.5–10.5)

## 2012-09-26 LAB — BASIC METABOLIC PANEL
CO2: 26 mEq/L (ref 19–32)
Chloride: 106 mEq/L (ref 96–112)
Glucose, Bld: 86 mg/dL (ref 70–99)
Potassium: 4.3 mEq/L (ref 3.5–5.1)
Sodium: 137 mEq/L (ref 135–145)

## 2012-09-26 LAB — LIPID PANEL
Cholesterol: 174 mg/dL (ref 0–200)
HDL: 49.9 mg/dL (ref >39.00)
LDL Cholesterol: 112 mg/dL — ABNORMAL HIGH (ref 0–99)
Triglycerides: 61 mg/dL (ref 0.0–149.0)

## 2012-09-26 LAB — HEPATIC FUNCTION PANEL
ALT: 15 U/L (ref 0–35)
AST: 19 U/L (ref 0–37)
Albumin: 4.4 g/dL (ref 3.5–5.2)
Alkaline Phosphatase: 46 U/L (ref 39–117)
Bilirubin, Direct: 0.2 mg/dL (ref 0.0–0.3)
Total Protein: 7.5 g/dL (ref 6.0–8.3)

## 2012-09-26 LAB — URINALYSIS, ROUTINE W REFLEX MICROSCOPIC
Bilirubin Urine: NEGATIVE
Ketones, ur: NEGATIVE
Total Protein, Urine: NEGATIVE
Urine Glucose: NEGATIVE

## 2012-10-02 ENCOUNTER — Ambulatory Visit (INDEPENDENT_AMBULATORY_CARE_PROVIDER_SITE_OTHER): Payer: BC Managed Care – PPO | Admitting: Internal Medicine

## 2012-10-02 ENCOUNTER — Encounter: Payer: Self-pay | Admitting: Internal Medicine

## 2012-10-02 VITALS — BP 110/70 | HR 75 | Temp 98.3°F | Ht 66.0 in | Wt 120.1 lb

## 2012-10-02 DIAGNOSIS — Z0000 Encounter for general adult medical examination without abnormal findings: Secondary | ICD-10-CM | POA: Insufficient documentation

## 2012-10-02 DIAGNOSIS — E785 Hyperlipidemia, unspecified: Secondary | ICD-10-CM

## 2012-10-02 DIAGNOSIS — Z0001 Encounter for general adult medical examination with abnormal findings: Secondary | ICD-10-CM | POA: Insufficient documentation

## 2012-10-02 NOTE — Progress Notes (Signed)
Subjective:    Patient ID: Carolyn Hancock, female    DOB: [birth year], 37 y.o.   MRN: [id number]  HPI  Here for wellness and f/u;  Overall doing ok;  Pt denies CP, worsening SOB, DOE, wheezing, orthopnea, PND, worsening LE edema, palpitations, dizziness or syncope.  Pt denies neurological change such as new headache, facial or extremity weakness.  Pt denies polydipsia, polyuria, or low sugar symptoms. Pt states overall good compliance with treatment and medications, good tolerability, and has been trying to follow lower cholesterol diet.  Pt denies worsening depressive symptoms, suicidal ideation or panic. No fever, night sweats, wt loss, loss of appetite, or other constitutional symptoms.  Pt states good ability with ADL's, has low fall risk, home safety reviewed and adequate, no other significant changes in hearing or vision, and only occasionally active with exercise.  No acute complaints.  Had low platelets with pregnancy, but now improved, no overt bleeding or bruising Past Medical History  Diagnosis Date  . Hyperlipidemia 06/03/2011  . Anemia, unspecified 06/07/2011  . Pelvic floor dysfunction 05/04/2010    Resolved; 4/5 Kegel and no UIC at [facility]/pap on 67/13  . Pre-conception counseling 07/22/2011  . History of abnormal Pap smear 07/28/2011    1997; repeats normal since  . Penicillin allergy 07/28/2011    rash   Past Surgical History  Procedure Laterality Date  . Tympanostomy tube placement    . Wisdom tooth extraction      reports that she has never smoked. She has never used smokeless tobacco. She reports that she does not drink alcohol or use illicit drugs. family history includes Heart failure in her paternal grandmother; Hyperlipidemia in her father and mother; Hypertension in her father and mother. Allergies  Allergen Reactions  . Penicillins Rash   No current outpatient prescriptions on file prior to visit.   No current facility-administered medications on file prior to visit.    Review of Systems Constitutional: Negative for diaphoresis, activity change, appetite change or unexpected weight change.  HENT: Negative for hearing loss, ear pain, facial swelling, mouth sores and neck stiffness.   Eyes: Negative for pain, redness and visual disturbance.  Respiratory: Negative for shortness of breath and wheezing.   Cardiovascular: Negative for chest pain and palpitations.  Gastrointestinal: Negative for diarrhea, blood in stool, abdominal distention or other pain Genitourinary: Negative for hematuria, flank pain or change in urine volume.  Musculoskeletal: Negative for myalgias and joint swelling.  Skin: Negative for color change and wound.  Neurological: Negative for syncope and numbness. other than noted Hematological: Negative for adenopathy.  Psychiatric/Behavioral: Negative for hallucinations, self-injury, decreased concentration and agitation.      Objective:   Physical Exam BP 110/70  Pulse 75  Temp(Src) 98.3 F (36.8 C) (Oral)  Ht 5\' 6"  (1.676 m)  Wt 120 lb 2 oz (54.488 kg)  BMI 19.4 kg/m2  SpO2 97%  LMP 09/20/2011 VS noted,  Constitutional: Pt is oriented to person, place, and time. Appears well-developed and well-nourished.  Head: Normocephalic and atraumatic.  Right Ear: External ear normal.  Left Ear: External ear normal.  Nose: Nose normal.  Mouth/Throat: Oropharynx is clear and moist.  Eyes: Conjunctivae and EOM are normal. Pupils are equal, round, and reactive to light.  Neck: Normal range of motion. Neck supple. No JVD present. No tracheal deviation present.  Cardiovascular: Normal rate, regular rhythm, normal heart sounds and intact distal pulses.   Pulmonary/Chest: Effort normal and breath sounds normal.  Abdominal: Soft. Bowel sounds are normal.  There is no tenderness. No HSM  Musculoskeletal: Normal range of motion. Exhibits no edema.  Lymphadenopathy:  Has no cervical adenopathy.  Neurological: Pt is alert and oriented to person,  place, and time. Pt has normal reflexes. No cranial nerve deficit.  Skin: Skin is warm and dry. No rash noted. mult freckles, several moles, fair skin Psychiatric:  Has  normal mood and affect. Behavior is normal.     Assessment & Plan:

## 2012-10-02 NOTE — Patient Instructions (Addendum)
Please continue all other medications as before, and refills have been done if requested. Please keep your appointments with your specialists as you have planned- dermatology Please continue your efforts at being more active, low cholesterol diet, and weight control. You are otherwise up to date with prevention measures today.  Please remember to sign up for My Chart if you have not done so, as this will be important to you in the future with finding out test results, communicating by private email, and scheduling acute appointments online when needed.  Please return in 1 year for your yearly visit, or sooner if needed, with Lab testing done 3-5 days before

## 2012-10-02 NOTE — Assessment & Plan Note (Signed)

## 2012-10-02 NOTE — Assessment & Plan Note (Signed)
For lower chol diet 

## 2012-10-05 ENCOUNTER — Encounter: Payer: BC Managed Care – PPO | Admitting: Internal Medicine

## 2012-12-20 ENCOUNTER — Other Ambulatory Visit: Payer: Self-pay

## 2013-10-31 ENCOUNTER — Other Ambulatory Visit (INDEPENDENT_AMBULATORY_CARE_PROVIDER_SITE_OTHER): Payer: BC Managed Care – PPO

## 2013-10-31 DIAGNOSIS — Z0000 Encounter for general adult medical examination without abnormal findings: Secondary | ICD-10-CM

## 2013-10-31 LAB — BASIC METABOLIC PANEL
BUN: 17 mg/dL (ref 6–23)
CHLORIDE: 102 meq/L (ref 96–112)
CO2: 27 meq/L (ref 19–32)
Calcium: 9.1 mg/dL (ref 8.4–10.5)
Creatinine, Ser: 0.9 mg/dL (ref 0.4–1.2)
GFR: 79.63 mL/min (ref >60.00)
GLUCOSE: 80 mg/dL (ref 70–99)
POTASSIUM: 3.9 meq/L (ref 3.5–5.1)
SODIUM: 136 meq/L (ref 135–145)

## 2013-10-31 LAB — URINALYSIS, ROUTINE W REFLEX MICROSCOPIC
Bilirubin Urine: NEGATIVE
Ketones, ur: NEGATIVE
LEUKOCYTES UA: NEGATIVE
NITRITE: NEGATIVE
Specific Gravity, Urine: <=1.005 — AB (ref 1.000–1.030)
TOTAL PROTEIN, URINE-UPE24: NEGATIVE
Urine Glucose: NEGATIVE
Urobilinogen, UA: 0.2 (ref 0.0–1.0)
pH: 6 (ref 5.0–8.0)

## 2013-10-31 LAB — CBC WITH DIFFERENTIAL/PLATELET
BASOS ABS: 0 10*3/uL (ref 0.0–0.1)
Basophils Relative: 0.6 % (ref 0.0–3.0)
EOS ABS: 0.1 10*3/uL (ref 0.0–0.7)
EOS PCT: 2.1 % (ref 0.0–5.0)
HEMATOCRIT: 44.5 % (ref 36.0–46.0)
Hemoglobin: 15.1 g/dL — ABNORMAL HIGH (ref 12.0–15.0)
LYMPHS ABS: 1.4 10*3/uL (ref 0.7–4.0)
Lymphocytes Relative: 25.4 % (ref 12.0–46.0)
MCHC: 33.9 g/dL (ref 30.0–36.0)
MCV: 89.5 fl (ref 78.0–100.0)
MONO ABS: 0.4 10*3/uL (ref 0.1–1.0)
Monocytes Relative: 8.2 % (ref 3.0–12.0)
NEUTROS PCT: 63.7 % (ref 43.0–77.0)
Neutro Abs: 3.5 10*3/uL (ref 1.4–7.7)
PLATELETS: 207 10*3/uL (ref 150.0–400.0)
RBC: 4.97 Mil/uL (ref 3.87–5.11)
RDW: 13.1 % (ref 11.5–15.5)
WBC: 5.5 10*3/uL (ref 4.0–10.5)

## 2013-10-31 LAB — LIPID PANEL
Cholesterol: 189 mg/dL (ref 0–200)
HDL: 54 mg/dL (ref >39.00)
LDL CALC: 109 mg/dL — AB (ref 0–99)
NONHDL: 135
Total CHOL/HDL Ratio: 4
Triglycerides: 131 mg/dL (ref 0.0–149.0)
VLDL: 26.2 mg/dL (ref 0.0–40.0)

## 2013-10-31 LAB — HEPATIC FUNCTION PANEL
ALT: 26 U/L (ref 0–35)
AST: 20 U/L (ref 0–37)
Albumin: 4 g/dL (ref 3.5–5.2)
Alkaline Phosphatase: 48 U/L (ref 39–117)
BILIRUBIN DIRECT: 0.1 mg/dL (ref 0.0–0.3)
BILIRUBIN TOTAL: 0.6 mg/dL (ref 0.2–1.2)
Total Protein: 7.6 g/dL (ref 6.0–8.3)

## 2013-10-31 LAB — TSH: TSH: 1.51 u[IU]/mL (ref 0.35–4.50)

## 2013-11-12 ENCOUNTER — Encounter: Payer: BC Managed Care – PPO | Admitting: Internal Medicine

## 2013-11-19 ENCOUNTER — Encounter: Payer: Self-pay | Admitting: Internal Medicine

## 2013-11-19 ENCOUNTER — Ambulatory Visit (INDEPENDENT_AMBULATORY_CARE_PROVIDER_SITE_OTHER): Payer: BC Managed Care – PPO | Admitting: Internal Medicine

## 2013-11-19 VITALS — BP 108/70 | HR 94 | Temp 98.4°F | Ht 66.0 in | Wt 115.1 lb

## 2013-11-19 DIAGNOSIS — Z23 Encounter for immunization: Secondary | ICD-10-CM

## 2013-11-19 DIAGNOSIS — Z0000 Encounter for general adult medical examination without abnormal findings: Secondary | ICD-10-CM

## 2013-11-19 NOTE — Patient Instructions (Signed)
You had the flu shot today  Your wellness form was signed and faxed  Please continue all other medications as before, and refills have been done if requested.  Please have the pharmacy call with any other refills you may need.  Please continue your efforts at being more active, low cholesterol diet, and weight control.  You are otherwise up to date with prevention measures today.  Please keep your appointments with your specialists as you may have planned  Please continue followup with your dermatologist for the multiple mole checks as you do  Please return in 1 year for your yearly visit, or sooner if needed, with Lab testing done 3-5 days before

## 2013-11-19 NOTE — Progress Notes (Signed)
Pre visit review using our clinic review tool, if applicable. No additional management support is needed unless otherwise documented below in the visit note. 

## 2013-11-24 NOTE — Assessment & Plan Note (Signed)

## 2013-11-24 NOTE — Progress Notes (Signed)
   Subjective:    Patient ID: Carolyn Hancock, female    DOB: October 05, 1975, 38 y.o.   MRN: [id number]  HPI  Here for wellness and f/u;  Overall doing ok;  Pt denies CP, worsening SOB, DOE, wheezing, orthopnea, PND, worsening LE edema, palpitations, dizziness or syncope.  Pt denies neurological change such as new headache, facial or extremity weakness.  Pt denies polydipsia, polyuria, or low sugar symptoms. Pt states overall good compliance with treatment and medications, good tolerability, and has been trying to follow lower cholesterol diet.  Pt denies worsening depressive symptoms, suicidal ideation or panic. No fever, night sweats, wt loss, loss of appetite, or other constitutional symptoms.  Pt states good ability with ADL's, has low fall risk, home safety reviewed and adequate, no other significant changes in hearing or vision, and only occasionally active with exercise.  For flu shot today    Review of Systems     Objective:   Physical Exam        Assessment & Plan:

## 2013-12-16 ENCOUNTER — Encounter: Payer: Self-pay | Admitting: Internal Medicine

## 2013-12-20 ENCOUNTER — Other Ambulatory Visit: Payer: Self-pay | Admitting: Medical

## 2013-12-20 MED ORDER — AZITHROMYCIN 250 MG PO TABS: ORAL_TABLET | ORAL | Status: DC

## 2013-12-24 ENCOUNTER — Telehealth: Payer: Self-pay | Admitting: Internal Medicine

## 2013-12-24 NOTE — Telephone Encounter (Signed)
Received 4 pages from [facility]. Sent to Dr. Jenny Reichmann. 12/24/13/ss

## 2014-02-11 ENCOUNTER — Telehealth: Payer: Self-pay | Admitting: Internal Medicine

## 2014-02-11 NOTE — Telephone Encounter (Signed)
Received medical records from [facility], sent to Dr. Jenny Reichmann. 02/11/14/ss

## 2014-03-27 ENCOUNTER — Ambulatory Visit (INDEPENDENT_AMBULATORY_CARE_PROVIDER_SITE_OTHER): Payer: BLUE CROSS/BLUE SHIELD | Admitting: Medical

## 2014-03-27 ENCOUNTER — Encounter: Payer: Self-pay | Admitting: Medical

## 2014-03-27 VITALS — BP 110/68 | HR 64 | Temp 98.4°F | Resp 15 | Ht 65.0 in | Wt 116.0 lb

## 2014-03-27 DIAGNOSIS — L989 Disorder of the skin and subcutaneous tissue, unspecified: Secondary | ICD-10-CM

## 2014-03-27 DIAGNOSIS — J029 Acute pharyngitis, unspecified: Secondary | ICD-10-CM

## 2014-03-27 LAB — POCT RAPID STREP A (OFFICE): Rapid Strep A Screen: NEGATIVE

## 2014-03-27 NOTE — Progress Notes (Signed)
Subjective:  Carolyn Hancock is a 39 y.o. female who presents for evaluation of sore throat.  New patient today.   She has had a recent close exposure to someone with proven streptococcal pharyngitis.  Associated symptoms include mild sore throat, runny nose, sneezing, fatigue, headache, but no cough, no nausea, vomiting, diarrhea, otherwise in usual state of health.  Treatment to date: none.  No other aggravating or relieving factors.    Has a few moles to look at.  No other c/o.  The following portions of the patient's history were reviewed and updated as appropriate: allergies, current medications, past medical history, past social history, past surgical history and problem list.  ROS as in subjective   Objective: Filed Vitals:   03/27/14 1419  BP: 110/68  Pulse: 64  Temp: 98.4 F (36.9 C)  Resp: 15    General appearance: no distress, WD/WN HEENT: normocephalic, conjunctiva/corneas normal, sclerae anicteric, nares patent, no discharge, + erythema, pharynx with limited erythema, no exudate.  Oral cavity: MMM, no lesions  Neck: supple, mild shoddy tender anterior nodes, no thyromegaly Heart: RRR, normal S1, S2, no murmurs Lungs: CTA bilaterally, no wheezes, rhonchi, or rales Skin: right upper back with 71mm triangular brown flat lesion, lower central chest with 52mm brown flat macule, several other scattered brown macules, no particular worrisome lesion   Laboratory Strep test done. Results:negative.    Assessment and Plan: Encounter Diagnoses  Name Primary?  . Sore throat Yes  . Skin lesion of back   . Skin lesion of chest wall    Advised that symptoms and exam suggest a viral etiology.  Discussed symptomatic treatment including salt water gargles, warm fluids, rest, hydrate well, can use over-the-counter Tylenol or Ibuprofen for throat pain, fever, or malaise. If worse or not improving within 2-3 days, call or return.  Skin lesions - she will have husband take a photo with  ruler of the 2 skin lesions to compare in 40mo.  F/u if changing/worsening.

## 2016-02-11 ENCOUNTER — Encounter: Payer: Self-pay | Admitting: Medical

## 2016-02-11 ENCOUNTER — Ambulatory Visit (INDEPENDENT_AMBULATORY_CARE_PROVIDER_SITE_OTHER): Payer: BLUE CROSS/BLUE SHIELD | Admitting: Medical

## 2016-02-11 VITALS — BP 122/70 | HR 86 | Temp 98.4°F | Wt 128.4 lb

## 2016-02-11 DIAGNOSIS — J988 Other specified respiratory disorders: Secondary | ICD-10-CM | POA: Diagnosis not present

## 2016-02-11 DIAGNOSIS — J029 Acute pharyngitis, unspecified: Secondary | ICD-10-CM | POA: Insufficient documentation

## 2016-02-11 MED ORDER — AZITHROMYCIN 250 MG PO TABS: ORAL_TABLET | ORAL | 0 refills | Status: DC

## 2016-02-11 MED ORDER — HYDROCODONE-HOMATROPINE 5-1.5 MG/5ML PO SYRP: 5.0000 mL | ORAL_SOLUTION | Freq: Three times a day (TID) | ORAL | 0 refills | Status: DC | PRN

## 2016-02-11 NOTE — Progress Notes (Signed)
  Subjective:  Perfecta Dufault is a 40 y.o. female who presents for illness.  Has had 1.5-2 week hx/o congestion, cough, ear pressure, headache, but last 3 days horrible sore throat drainage.  Whole household sick starting with husband who has had similar for 3 weeks.   No strep exposure but has been around kids and lots of family   Using some Delsym.  No body aches, chills, fever, NVD.  Nonsmoker.  No other aggravating or relieving factors. No other complaint.  Past Medical History:  Diagnosis Date  . Anemia, unspecified 06/07/2011  . History of abnormal Pap smear 07/28/2011   1997; repeats normal since  . Hyperlipidemia 06/03/2011  . Pelvic floor dysfunction 05/04/2010   Resolved; 4/5 Kegel and no UIC at Aex/pap on 67/13  . Penicillin allergy 07/28/2011   rash  . Pre-conception counseling 07/22/2011    ROS as in subjective   Objective: BP 122/70   Pulse 86   Temp 98.4 F (36.9 C)   Wt 128 lb 6.4 oz (58.2 kg)   SpO2 97%   BMI 21.37 kg/m   General appearance: Alert, WD/WN, no distress                             Skin: warm, no rash                           Head: no sinus tenderness,                            Eyes: conjunctiva normal, corneas clear, PERRLA                            Ears: pearly TMs, external ear canals normal                          Nose: septum midline, turbinates swollen, with erythema and no discharge             Mouth/throat: MMM, tongue normal, moderate pharyngeal erythema,no exudate                           Neck: supple,shotty tender anterior nodes, no thyromegaly, non tender                        Lungs: CTA bilaterally, no wheezes, rales, or rhonchi       Assessment  Encounter Diagnoses  Name Primary?  . Acute pharyngitis, unspecified etiology Yes  . Respiratory tract infection       Plan: Begin medications below, rest, hydrate well, discussed supportive care.   Montoya was seen today for sore throat , congestion.  Diagnoses and all orders  for this visit:  Acute pharyngitis, unspecified etiology  Respiratory tract infection  Other orders -     azithromycin (ZITHROMAX) 250 MG tablet; 2 tablets day 1, then 1 tablet days 2-4 -     HYDROcodone-homatropine (HYCODAN) 5-1.5 MG/5ML syrup; Take 5 mLs by mouth every 8 (eight) hours as needed for cough.  Patient was advised to call or return if worse or not improving in the next few days.    Patient voiced understanding of diagnosis, recommendations, and treatment plan.

## 2016-10-10 ENCOUNTER — Ambulatory Visit (INDEPENDENT_AMBULATORY_CARE_PROVIDER_SITE_OTHER): Payer: BLUE CROSS/BLUE SHIELD | Admitting: Family Medicine

## 2016-10-10 ENCOUNTER — Encounter: Payer: Self-pay | Admitting: Family Medicine

## 2016-10-10 VITALS — BP 110/80 | HR 71 | Temp 97.6°F | Wt 124.4 lb

## 2016-10-10 DIAGNOSIS — J020 Streptococcal pharyngitis: Secondary | ICD-10-CM

## 2016-10-10 DIAGNOSIS — J029 Acute pharyngitis, unspecified: Secondary | ICD-10-CM | POA: Diagnosis not present

## 2016-10-10 LAB — POCT RAPID STREP A (OFFICE): Rapid Strep A Screen: POSITIVE — AB

## 2016-10-10 MED ORDER — AZITHROMYCIN 250 MG PO TABS: ORAL_TABLET | ORAL | 0 refills | Status: DC

## 2016-10-10 NOTE — Progress Notes (Signed)
Subjective:  Carolyn Hancock is a 41 y.o. female who presents for evaluation of sore throat.  She has not had a recent close exposure to someone with proven streptococcal pharyngitis.  Associated symptoms include post nasal drainage, rhinorrhea.   Denies fever, chills, headache, ear pain, sinus pressure, chest pain, palpitations, cough, shortness of breath, abdominal pain, N/V/D.    Treatment to date: antihistamines and decongestants.  Positive sick contacts.  No other aggravating or relieving factors.  No other c/o.  The following portions of the patient's history were reviewed and updated as appropriate: allergies, current medications, past medical history, past social history, past surgical history and problem list.  ROS as in subjective   Objective: Vitals:   10/10/16 1036  BP: 110/80  Pulse: 71  Temp: 97.6 F (36.4 C)    General appearance: no distress, WD/WN, mildly ill-appearing HEENT: normocephalic, conjunctiva/corneas normal, sclerae anicteric, nares patent, no discharge or erythema, pharynx with erythema, mild exudate.  Oral cavity: MMM, no lesions  Neck: supple, no lymphadenopathy, no thyromegaly Heart: RRR, normal S1, S2, no murmurs Lungs: CTA bilaterally, no wheezes, rhonchi, or rales   Laboratory Strep test done. Results:positive.    Assessment and Plan: Strep pharyngitis - Plan: azithromycin (ZITHROMAX Z-PAK) 250 MG tablet  Sore throat - Plan: POCT rapid strep A   Advised that symptoms and exam suggest a strep etiology. Z-pak prescribed (PCN allergy).  Discussed symptomatic treatment including salt water gargles, warm fluids, rest, hydrate well, can use over-the-counter Tylenol or ibuprofen for throat pain, fever, or malaise. If worse or not back to baseline after completing the antbiotic, call or return.

## 2016-10-10 NOTE — Patient Instructions (Signed)
So salt water gargles, take advil or tylenol and stay well hydrated.  Complete the antibiotic and let me know if you are not back to baseline.   Strep Throat Strep throat is a bacterial infection of the throat. Your health care provider may call the infection tonsillitis or pharyngitis, depending on whether there is swelling in the tonsils or at the back of the throat. Strep throat is most common during the cold months of the year in children who are 56-80 years of age, but it can happen during any season in people of any age. This infection is spread from person to person (contagious) through coughing, sneezing, or close contact. What are the causes? Strep throat is caused by the bacteria called Streptococcus pyogenes. What increases the risk? This condition is more likely to develop in:  People who spend time in crowded places where the infection can spread easily.  People who have close contact with someone who has strep throat.  What are the signs or symptoms? Symptoms of this condition include:  Fever or chills.  Redness, swelling, or pain in the tonsils or throat.  Pain or difficulty when swallowing.  White or yellow spots on the tonsils or throat.  Swollen, tender glands in the neck or under the jaw.  Red rash all over the body (rare).  How is this diagnosed? This condition is diagnosed by performing a rapid strep test or by taking a swab of your throat (throat culture test). Results from a rapid strep test are usually ready in a few minutes, but throat culture test results are available after one or two days. How is this treated? This condition is treated with antibiotic medicine. Follow these instructions at home: Medicines  Take over-the-counter and prescription medicines only as told by your health care provider.  Take your antibiotic as told by your health care provider. Do not stop taking the antibiotic even if you start to feel better.  Have family members who  also have a sore throat or fever tested for strep throat. They may need antibiotics if they have the strep infection. Eating and drinking  Do not share food, drinking cups, or personal items that could cause the infection to spread to other people.  If swallowing is difficult, try eating soft foods until your sore throat feels better.  Drink enough fluid to keep your urine clear or pale yellow. General instructions  Gargle with a salt-water mixture 3-4 times per day or as needed. To make a salt-water mixture, completely dissolve -1 tsp of salt in 1 cup of warm water.  Make sure that all household members wash their hands well.  Get plenty of rest.  Stay home from school or work until you have been taking antibiotics for 24 hours.  Keep all follow-up visits as told by your health care provider. This is important. Contact a health care provider if:  The glands in your neck continue to get bigger.  You develop a rash, cough, or earache.  You cough up a thick liquid that is green, yellow-brown, or bloody.  You have pain or discomfort that does not get better with medicine.  Your problems seem to be getting worse rather than better.  You have a fever. Get help right away if:  You have new symptoms, such as vomiting, severe headache, stiff or painful neck, chest pain, or shortness of breath.  You have severe throat pain, drooling, or changes in your voice.  You have swelling of the neck, or  the skin on the neck becomes red and tender.  You have signs of dehydration, such as fatigue, dry mouth, and decreased urination.  You become increasingly sleepy, or you cannot wake up completely.  Your joints become red or painful. This information is not intended to replace advice given to you by your health care provider. Make sure you discuss any questions you have with your health care provider. Document Released: 01/29/2000 Document Revised: 09/30/2015 Document Reviewed:  05/26/2014 Elsevier Interactive Patient Education  2017 [employer].

## 2016-12-30 ENCOUNTER — Ambulatory Visit: Payer: BLUE CROSS/BLUE SHIELD | Admitting: Medical

## 2016-12-30 ENCOUNTER — Ambulatory Visit (INDEPENDENT_AMBULATORY_CARE_PROVIDER_SITE_OTHER): Payer: BLUE CROSS/BLUE SHIELD | Admitting: Medical

## 2016-12-30 ENCOUNTER — Encounter: Payer: Self-pay | Admitting: Medical

## 2016-12-30 VITALS — BP 118/80 | HR 81 | Wt 127.8 lb

## 2016-12-30 DIAGNOSIS — H938X2 Other specified disorders of left ear: Secondary | ICD-10-CM

## 2016-12-30 DIAGNOSIS — J3489 Other specified disorders of nose and nasal sinuses: Secondary | ICD-10-CM

## 2016-12-30 DIAGNOSIS — S39012A Strain of muscle, fascia and tendon of lower back, initial encounter: Secondary | ICD-10-CM | POA: Diagnosis not present

## 2016-12-30 MED ORDER — CYCLOBENZAPRINE HCL 10 MG PO TABS: ORAL_TABLET | ORAL | 0 refills | Status: DC

## 2016-12-30 NOTE — Progress Notes (Signed)
Subjective:    Carolyn Hancock is a 41 y.o. female who presents for evaluation of low back pain.  Symptoms have been present for 4 days and are gradually worsening.    Onset was related to / precipitated by no known injury and but thinks she turned funny in bed causing a strain of the back.. The pain is located in the left lumbar area and does not radiate. The pain is described as aching, soreness and stiffness and occurs intermittently.   She rates her pain as moderate. Symptoms are exacerbated by coughing, exercise, extension, flexion, lifting, sitting and standing. Symptoms are improved by nothing.   She has no other symptoms associated with the back pain. The patient has no "red flag" history indicative of complicated back pain..  Denies saddle anesthesia or incontinence.  She has mild left ear pressure and slight head congestion last few days.  No fever, no other URI symptoms.  The following portions of the patient's history were reviewed and updated as appropriate: allergies, current medications, past family history, past medical history, past social history, past surgical history and problem list.  Review of Systems Constitutional: denies fever, chills, sweats, unexpected weight change, anorexia, fatigue Dermatology: rash, bruising, redness Cardiology: denies chest pain, palpitations, edema Respiratory: denies cough, shortness of breath, dyspnea Gastroenterology: denies abdominal pain, nausea, vomiting, diarrhea, constipation, blood in stool, changes in bowel movement, dysphagia Musculoskeletal: denies arthralgias, joint swelling, neck pain, cramping Urology: denies dysuria, difficulty urinating, hematuria, urinary frequency, urgency, incontinence      Objective:    BP 118/80   Pulse 81   Wt 127 lb 12.8 oz (58 kg)   SpO2 98%   BMI 21.27 kg/m   General appearance: alert, no distress, WD/WN, female  HENT unremarkable Lungs clear Abdomen: +bs, soft, non tender, non  distended, no masses, no hepatomegaly, no splenomegaly, no bruits Back with no obvious deformity, tender over left lumbar paraspinal region, pain with flexion and extension which is limited to 50% of normal ROM.  -SLR MSK: legs nontender, normal ROM, no obvious deformity Extremities: no edema, no cyanosis, no clubbing Pulses: 2+ symmetric, upper and lower extremities     Assessment:   Encounter Diagnoses  Name Primary?  . Strain of lumbar region, initial encounter Yes  . Ear pressure, left   . Sinus pressure       Plan:   Symptoms and exam suggests low back strain and spasm.   Discussed differential for low back pain.     Recommendations Use rest with no heavy lifting for the next 7-10 days You can use gentle stretching and range of motion activity to avoid stiffness and to help stretch the muscles You can use heat such as warm towel, hot shower, or hot tub  You can consider getting a massage Begin Ibuprofen OTC, 3 tablets BID - TID  for pain and inflammation for 5-7 days. Take this with food. You can use the muscle relaxer Flexeril, at bedtime or up to twice daily.  Caution as this can make you drowsy.  If not gradually seeing improvement over the next 4-5 days, or of worse or new symptoms, then recheck right away.  Ear pressure, sinus pressure - gave samples of Norel AD to use BID for a few days, hydrate well, rest.    Follow up prn  [name] was seen today for back pain.  Diagnoses and all orders for this visit:  Strain of lumbar region, initial encounter  Ear pressure, left  Sinus pressure  Other orders -     cyclobenzaprine (FLEXERIL) 10 MG tablet; 1/2 - 1 tablet po QHS or up to BID

## 2016-12-30 NOTE — Patient Instructions (Signed)
Low Back Strain A strain is a stretch or tear in a muscle or the strong cords of tissue that attach muscle to bone (tendons). Strains of the lower back (lumbar spine) are a common cause of low back pain. A strain occurs when muscles or tendons are torn or are stretched beyond their limits. The muscles may become inflamed, resulting in pain and sudden muscle tightening (spasms). A strain can happen suddenly due to an injury (trauma), or it can develop gradually due to overuse. There are three types of strains:  Grade 1 is a mild strain involving a minor tear of the muscle fibers or tendons. This may cause some pain but no loss of muscle strength.  Grade 2 is a moderate strain involving a partial tear of the muscle fibers or tendons. This causes more severe pain and some loss of muscle strength.  Grade 3 is a severe strain involving a complete tear of the muscle or tendon. This causes severe pain and complete or nearly complete loss of muscle strength. What are the causes? This condition may be caused by:  Trauma, such as a fall or a hit to the body.  Twisting or overstretching the back. This may result from doing activities that require a lot of energy, such as lifting heavy objects. What increases the risk? The following factors may increase your risk of getting this condition:  Playing contact sports.  Participating in sports or activities that put excessive stress on the back and require a lot of bending and twisting, including:  Lifting weights or heavy objects.  Gymnastics.  Soccer.  Figure skating.  Snowboarding.  Being overweight or obese.  Having poor strength and flexibility. What are the signs or symptoms? Symptoms of this condition may include:  Sharp or dull pain in the lower back that does not go away. Pain may extend to the buttocks.  Stiffness.  Limited range of motion.  Inability to stand up straight due to stiffness or pain.  Muscle spasms. How is this  diagnosed?   This condition may be diagnosed based on:  Your symptoms.  Your medical history.  A physical exam.  Your health care provider may push on certain areas of your back to determine the source of your pain.  You may be asked to bend forward, backward, and side to side to assess the severity of your pain and your range of motion.  Imaging tests, such as:  X-rays.  MRI. How is this treated? Treatment for this condition may include:  Applying heat and cold to the affected area.  Medicines to help relieve pain and to relax your muscles (muscle relaxants).  NSAIDs to help reduce swelling and discomfort.  Physical therapy. When your symptoms improve, it is important to gradually return to your normal routine as soon as possible to reduce pain, avoid stiffness, and avoid loss of muscle strength. Generally, symptoms should improve within 6 weeks of treatment. However, recovery time varies. Follow these instructions at home: Managing pain, stiffness, and swelling  If directed, apply ice to the injured area during the first 24 hours after your injury.  Put ice in a plastic bag.  Place a towel between your skin and the bag.  Leave the ice on for 20 minutes, 2-3 times a day.  If directed, apply heat to the affected area as often as told by your health care provider. Use the heat source that your health care provider recommends, such as a moist heat pack or a heating pad.    Place a towel between your skin and the heat source.  Leave the heat on for 20-30 minutes.  Remove the heat if your skin turns bright red. This is especially important if you are unable to feel pain, heat, or cold. You may have a greater risk of getting burned. Activity  Rest and return to your normal activities as told by your health care provider. Ask your health care provider what activities are safe for you.  Avoid activities that take a lot of effort (are strenuous) for as long as told by your  health care provider.  Do exercises as told by your health care provider. General instructions  Take over-the-counter and prescription medicines only as told by your health care provider.  If you have questions or concerns about safety while taking pain medicine, talk with your health care provider.  Do not drive or operate heavy machinery until you know how your pain medicine affects you.  Do not use any tobacco products, such as cigarettes, chewing tobacco, and e-cigarettes. Tobacco can delay bone healing. If you need help quitting, ask your health care provider.  Keep all follow-up visits as told by your health care provider. This is important. How is this prevented?  Warm up and stretch before being active.  Cool down and stretch after being active.  Give your body time to rest between periods of activity.  Avoid:  Being physically inactive for long periods at a time.  Exercising or playing sports when you are tired or in pain.  Use correct form when playing sports and lifting heavy objects.  Use good posture when sitting and standing.  Maintain a healthy weight.  Sleep on a mattress with medium firmness to support your back.  Make sure to use equipment that fits you, including shoes that fit well.  Be safe and responsible while being active to avoid falls.  Do at least 150 minutes of moderate-intensity exercise each week, such as brisk walking or water aerobics. Try a form of exercise that takes stress off your back, such as swimming or stationary cycling.  Maintain physical fitness, including:  Strength.  Flexibility.  Cardiovascular fitness.  Endurance. Contact a health care provider if:  Your back pain does not improve after 6 weeks of treatment.  Your symptoms get worse. Get help right away if:  Your back pain is severe.  You are unable to stand or walk.  You develop pain in your legs.  You develop weakness in your buttocks or legs.  You have  difficulty controlling when you urinate or when you have a bowel movement. This information is not intended to replace advice given to you by your health care provider. Make sure you discuss any questions you have with your health care provider. Document Released: 01/31/2005 Document Revised: 10/08/2015 Document Reviewed: 11/12/2014 Elsevier Interactive Patient Education  2017 [employer]  

## 2017-01-13 DIAGNOSIS — Z1231 Encounter for screening mammogram for malignant neoplasm of breast: Secondary | ICD-10-CM | POA: Diagnosis not present

## 2017-01-13 DIAGNOSIS — Z01419 Encounter for gynecological examination (general) (routine) without abnormal findings: Secondary | ICD-10-CM | POA: Diagnosis not present

## 2017-01-13 DIAGNOSIS — Z6821 Body mass index (BMI) 21.0-21.9, adult: Secondary | ICD-10-CM | POA: Diagnosis not present

## 2017-01-13 DIAGNOSIS — Z3041 Encounter for surveillance of contraceptive pills: Secondary | ICD-10-CM | POA: Diagnosis not present

## 2017-11-20 ENCOUNTER — Encounter: Payer: Self-pay | Admitting: Internal Medicine

## 2017-11-20 ENCOUNTER — Ambulatory Visit (INDEPENDENT_AMBULATORY_CARE_PROVIDER_SITE_OTHER): Payer: BLUE CROSS/BLUE SHIELD | Admitting: Internal Medicine

## 2017-11-20 VITALS — BP 112/74 | HR 83 | Temp 98.6°F | Ht 65.0 in | Wt 130.0 lb

## 2017-11-20 DIAGNOSIS — M545 Low back pain, unspecified: Secondary | ICD-10-CM

## 2017-11-20 DIAGNOSIS — M79605 Pain in left leg: Secondary | ICD-10-CM | POA: Diagnosis not present

## 2017-11-20 DIAGNOSIS — Z23 Encounter for immunization: Secondary | ICD-10-CM | POA: Diagnosis not present

## 2017-11-20 DIAGNOSIS — Z0000 Encounter for general adult medical examination without abnormal findings: Secondary | ICD-10-CM

## 2017-11-20 MED ORDER — MELOXICAM 15 MG PO TABS: 15.0000 mg | ORAL_TABLET | Freq: Every day | ORAL | 1 refills | Status: DC

## 2017-11-20 NOTE — Assessment & Plan Note (Signed)
>   6 wks, not better with conservative tx including advil and flexeril; ok for mobic 15 qd prn, PT referral, [facility] Spine MRI and ortho referral

## 2017-11-20 NOTE — Progress Notes (Signed)
Subjective:    Patient ID: Carolyn Hancock, female    DOB: April 27, 1975, 42 y.o.   MRN: [id number]  HPI   Here to [place]; last seen 2015 and has been to [facility] very few times with URI's per pt o/w without significant interval illness;  Now here with 3 mo onset gradually worsening Left lower back pain, now moderate, constant, started after slept on floor with her child one night, and has not gotten better even with a change of bed that was too soft. Now for 6 wks also assoc with LLE pain to below the knee, and more recently now persistent tingling to the left foot.  She is not aware of significant weakness or falls.  Pt denies other new neurological symptoms such as new headache, or facial or extremity weakness or numbness.  Pain worse to run and jump, so only does yoga like excercises recently, has not gained signficant weight.  Denies urinary symptoms such as dysuria, frequency, urgency, flank pain, hematuria or n/v, fever, chills.  Denies worsening reflux, abd pain, dysphagia, n/v, bowel change or blood.   Pt denies fever, wt loss, night sweats, loss of appetite, or other constitutional symptoms Past Medical History:  Diagnosis Date  . Anemia, unspecified 06/07/2011  . History of abnormal Pap smear 07/28/2011   1997; repeats normal since  . Hyperlipidemia 06/03/2011  . Pelvic floor dysfunction 05/04/2010   Resolved; 4/5 Kegel and no UIC at [facility]/pap on 67/13  . Penicillin allergy 07/28/2011   rash  . Pre-conception counseling 07/22/2011   Past Surgical History:  Procedure Laterality Date  . TYMPANOSTOMY TUBE PLACEMENT    . WISDOM TOOTH EXTRACTION      reports that she has never smoked. She has never used smokeless tobacco. She reports that she does not drink alcohol or use drugs. family history includes Heart failure in her paternal grandmother; Hyperlipidemia in her father and mother; Hypertension in her father and mother. Allergies  Allergen Reactions  . Penicillins Rash   Current Outpatient  Medications on File Prior to Visit  Medication Sig Dispense Refill  . TRI-PREVIFEM 0.18/0.215/0.25 MG-35 MCG tablet Take 1 tablet by mouth daily.  4   No current facility-administered medications on file prior to visit.    Review of Systems  Constitutional: Negative for other unusual diaphoresis or sweats HENT: Negative for ear discharge or swelling Eyes: Negative for other worsening visual disturbances Respiratory: Negative for stridor or other swelling  Gastrointestinal: Negative for worsening distension or other blood Genitourinary: Negative for retention or other urinary change Musculoskeletal: Negative for other MSK pain or swelling Skin: Negative for color change or other new lesions Neurological: Negative for worsening tremors and other numbness  Psychiatric/Behavioral: Negative for worsening agitation or other fatigue All other system neg per pt    Objective:   Physical Exam BP 112/74   Pulse 83   Temp 98.6 F (37 C) (Oral)   Ht 5\' 5"  (1.651 m)   Wt 130 lb (59 kg)   SpO2 98%   BMI 21.63 kg/m  VS noted,  Constitutional: Pt appears in NAD HENT: Head: NCAT.  Right Ear: External ear normal.  Left Ear: External ear normal.  Eyes: . Pupils are equal, round, and reactive to light. Conjunctivae and EOM are normal Nose: without d/c or deformity Neck: Neck supple. Gross normal ROM Cardiovascular: Normal rate and regular rhythm.   Pulmonary/Chest: Effort normal and breath sounds without rales or wheezing.  Abd:  Soft, NT, ND, + BS, no organomegaly  Spine nontender in the midline Neurological: Pt is alert. At baseline orientation, motor grossly intact, ? Somewhat diminished left patellar reflex, and has decreased sensation to the left upper anterior leg Skin: Skin is warm. No rashes, other new lesions, no LE edema Psychiatric: Pt behavior is normal without agitation  No other exam findings Lab Results  Component Value Date   WBC 5.5 10/31/2013   HGB 15.1 (H) 10/31/2013    HCT 44.5 10/31/2013   PLT 207.0 10/31/2013   GLUCOSE 80 10/31/2013   CHOL 189 10/31/2013   TRIG 131.0 10/31/2013   HDL 54.00 10/31/2013   LDLDIRECT 151.1 04/16/2010   LDLCALC 109 (H) 10/31/2013   ALT 26 10/31/2013   AST 20 10/31/2013   NA 136 10/31/2013   K 3.9 10/31/2013   CL 102 10/31/2013   CREATININE 0.9 10/31/2013   BUN 17 10/31/2013   CO2 27 10/31/2013   TSH 1.51 10/31/2013   INR 0.92 03/28/2010       Assessment & Plan:

## 2017-11-20 NOTE — Patient Instructions (Addendum)
You had the flu shot today  Please take all new medication as prescribed - the anti-inflammatory  You will be contacted regarding the referral for: MRI, Physical Therapy, and Orthopedic  Please continue all other medications as before, and refills have been done if requested.  Please have the pharmacy call with any other refills you may need.  Please continue your efforts at being more active, low cholesterol diet, and weight control.  Please keep your appointments with your specialists as you may have planned  Please return in 6 months, or sooner if needed, with Lab testing done 3-5 days before

## 2017-12-02 ENCOUNTER — Ambulatory Visit
Admission: RE | Admit: 2017-12-02 | Discharge: 2017-12-02 | Disposition: A | Discharge status: Home / Self Care | Payer: BLUE CROSS/BLUE SHIELD | Source: Ambulatory Visit | Attending: Internal Medicine | Admitting: Internal Medicine

## 2017-12-02 DIAGNOSIS — M545 Low back pain, unspecified: Secondary | ICD-10-CM

## 2017-12-02 DIAGNOSIS — M5116 Intervertebral disc disorders with radiculopathy, lumbar region: Secondary | ICD-10-CM | POA: Diagnosis not present

## 2017-12-02 DIAGNOSIS — M79605 Pain in left leg: Principal | ICD-10-CM

## 2017-12-05 DIAGNOSIS — M545 Low back pain: Secondary | ICD-10-CM | POA: Diagnosis not present

## 2017-12-05 DIAGNOSIS — M5416 Radiculopathy, lumbar region: Secondary | ICD-10-CM | POA: Diagnosis not present

## 2017-12-05 DIAGNOSIS — M5116 Intervertebral disc disorders with radiculopathy, lumbar region: Secondary | ICD-10-CM | POA: Diagnosis not present

## 2017-12-12 DIAGNOSIS — M5126 Other intervertebral disc displacement, lumbar region: Secondary | ICD-10-CM | POA: Insufficient documentation

## 2017-12-27 DIAGNOSIS — Z6821 Body mass index (BMI) 21.0-21.9, adult: Secondary | ICD-10-CM | POA: Diagnosis not present

## 2017-12-27 DIAGNOSIS — Z01419 Encounter for gynecological examination (general) (routine) without abnormal findings: Secondary | ICD-10-CM | POA: Diagnosis not present

## 2017-12-27 DIAGNOSIS — Z124 Encounter for screening for malignant neoplasm of cervix: Secondary | ICD-10-CM | POA: Diagnosis not present

## 2017-12-27 DIAGNOSIS — Z309 Encounter for contraceptive management, unspecified: Secondary | ICD-10-CM | POA: Diagnosis not present

## 2017-12-27 LAB — HM PAP SMEAR

## 2018-01-02 DIAGNOSIS — M5416 Radiculopathy, lumbar region: Secondary | ICD-10-CM | POA: Diagnosis not present

## 2018-01-10 ENCOUNTER — Other Ambulatory Visit: Payer: Self-pay | Admitting: Internal Medicine

## 2018-01-10 MED ORDER — MELOXICAM 15 MG PO TABS: 15.0000 mg | ORAL_TABLET | Freq: Every day | ORAL | 1 refills | Status: DC

## 2018-01-10 NOTE — Telephone Encounter (Signed)
Copied from [place] [phone]. Topic: Quick Communication - Rx Refill/Question >> Jan 10, 2018  3:13 PM Ahmed Prima L wrote: Medication: meloxicam (MOBIC) 15 MG tablet  Has the patient contacted their pharmacy? yes (Agent: If no, request that the patient contact the pharmacy for the refill.) (Agent: If yes, when and what did the pharmacy advise?)  Preferred Pharmacy (with phone number or street [facility] ( initially [facility]ed 11/13 and 11/18 ) [facility]  Agent: Please be advised that RX refills may take up to 3 business days. We ask that you follow-up with your pharmacy.

## 2018-01-10 NOTE — Telephone Encounter (Signed)
Requested medication (s) are due for refill today -yes  Requested medication (s) are on the active medication list -yes  Future visit scheduled -yes  Last refill: 11/20/17 1 RF  Notes to clinic: Sent for provider review- ? Short term Rx  Requested Prescriptions  Pending Prescriptions Disp Refills   meloxicam (MOBIC) 15 MG tablet 90 tablet 1    Sig: Take 1 tablet (15 mg total) by mouth daily.     Analgesics:  COX2 Inhibitors Failed - 01/10/2018  3:42 PM      Failed - HGB in normal range and within 360 days    Hemoglobin  Date Value Ref Range Status  10/31/2013 15.1 (H) 12.0 - 15.0 g/dL Final         Failed - Cr in normal range and within 360 days    Creatinine, Ser  Date Value Ref Range Status  10/31/2013 0.9 0.4 - 1.2 mg/dL Final         Passed - Patient is not pregnant      Passed - Valid encounter within last 12 months    Recent Outpatient Visits          1 month ago Low back pain radiating to left leg   [place], James W, MD   4 years ago Preventative health care   [facility] -Georges Mouse, MD   5 years ago Preventative health care   [facility] -Georges Mouse, MD   6 years ago Anemia, unspecified   [place], [place]      Future Appointments            In 4 months Biagio Borg, MD [place], [facility]            Requested Prescriptions  Pending Prescriptions Disp Refills   meloxicam (MOBIC) 15 MG tablet 90 tablet 1    Sig: Take 1 tablet (15 mg total) by mouth daily.     Analgesics:  COX2 Inhibitors Failed - 01/10/2018  3:42 PM      Failed - HGB in normal range and within 360 days    Hemoglobin  Date Value Ref Range Status  10/31/2013 15.1 (H) 12.0 - 15.0 g/dL Final         Failed - Cr in normal range and within 360 days    Creatinine, Ser  Date Value Ref Range Status  10/31/2013 0.9 0.4 - 1.2 mg/dL  Final         Passed - Patient is not pregnant      Passed - Valid encounter within last 12 months    Recent Outpatient Visits          1 month ago Low back pain radiating to left leg   [place], James W, MD   4 years ago Preventative health care   [facility] -Junie Bame, Hunt Oris, MD   5 years ago Preventative health care   [facility] -Georges Mouse, MD   6 years ago Anemia, unspecified   [place], James W, MD      Future Appointments            In 4 months Jenny Reichmann, Hunt Oris, MD [place], [facility]

## 2018-02-19 DIAGNOSIS — Z1231 Encounter for screening mammogram for malignant neoplasm of breast: Secondary | ICD-10-CM | POA: Diagnosis not present

## 2018-02-26 DIAGNOSIS — M5126 Other intervertebral disc displacement, lumbar region: Secondary | ICD-10-CM | POA: Diagnosis not present

## 2018-05-30 ENCOUNTER — Ambulatory Visit: Payer: BLUE CROSS/BLUE SHIELD | Admitting: Internal Medicine

## 2018-12-17 DIAGNOSIS — Z3040 Encounter for surveillance of contraceptives, unspecified: Secondary | ICD-10-CM | POA: Diagnosis not present

## 2018-12-17 DIAGNOSIS — Z01411 Encounter for gynecological examination (general) (routine) with abnormal findings: Secondary | ICD-10-CM | POA: Diagnosis not present

## 2018-12-17 DIAGNOSIS — R112 Nausea with vomiting, unspecified: Secondary | ICD-10-CM | POA: Diagnosis not present

## 2018-12-17 DIAGNOSIS — Z6821 Body mass index (BMI) 21.0-21.9, adult: Secondary | ICD-10-CM | POA: Diagnosis not present

## 2019-01-11 IMAGING — MR MR LUMBAR SPINE W/O CM
4 of 5 series · 25 of 48 positions shown · non-contrast
Comparison: None.

CLINICAL DATA: Back pain radiating to the left leg

EXAM:
MRI LUMBAR SPINE WITHOUT CONTRAST
TECHNIQUE: Multiplanar, multisequence MR imaging of the lumbar spine was
performed. No intravenous contrast was administered.

[Series 2: T2 · sagittal · 4.0mm · 0.73mm/px · 6 of 15 slices shown (1 of 2)]
[im 1/15]
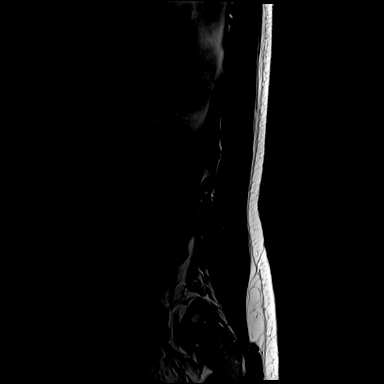
[im 3/15]
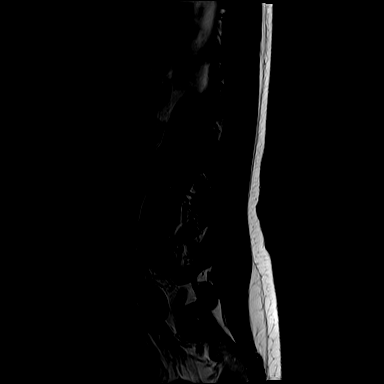
[im 6/15]
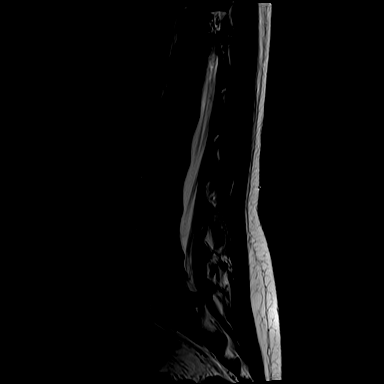
[im 9/15]
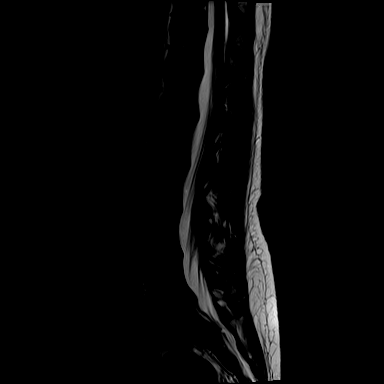
[im 12/15]
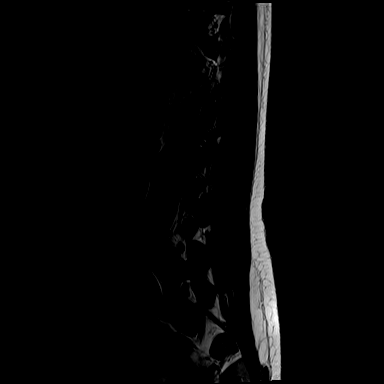
[im 15/15]
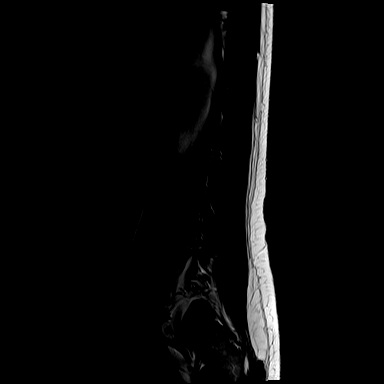

[Series 4: T1 · sagittal · 4.0mm · 0.88mm/px · 6 of 15 slices shown (1 of 2)]
[im 1/15]
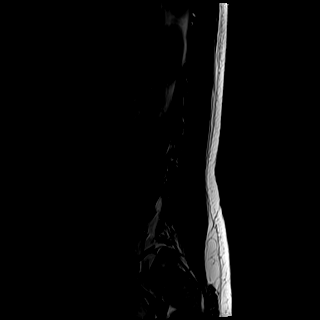
[im 3/15]
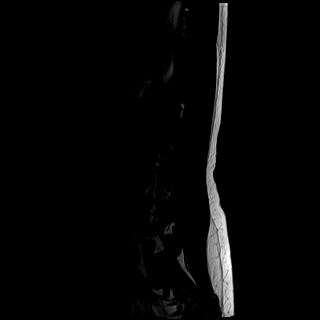
[im 6/15]
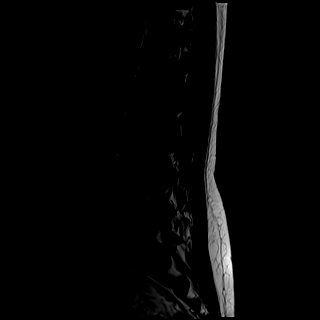
[im 9/15]
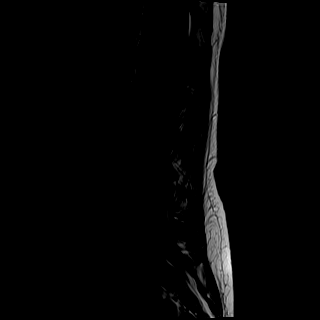
[im 12/15]
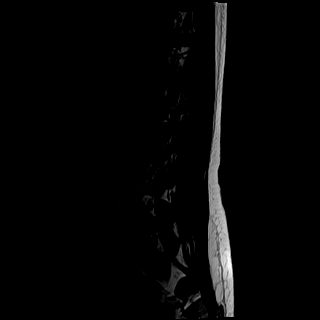
[im 15/15]
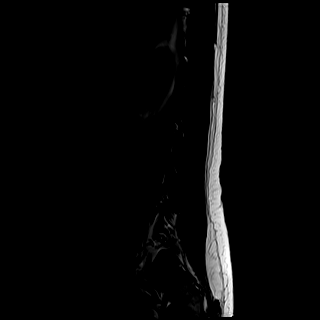

[Series 5: T2 · axial · 4.0mm · 0.39mm/px · z∈[-77,+146]mm · 9 of 35 slices shown (2 of 2)]
[im 1/35]
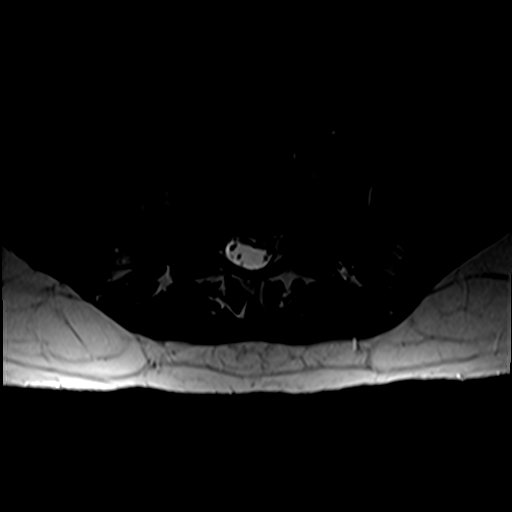
[im 5/35]
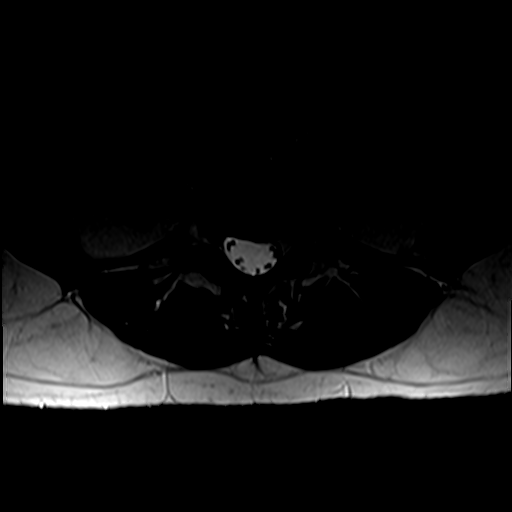
[im 10/35]
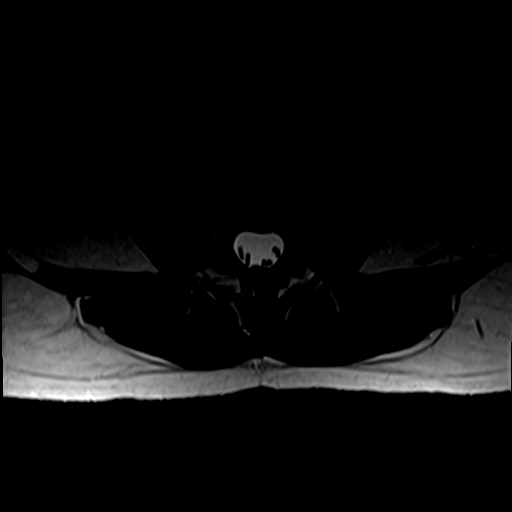
[im 15/35]
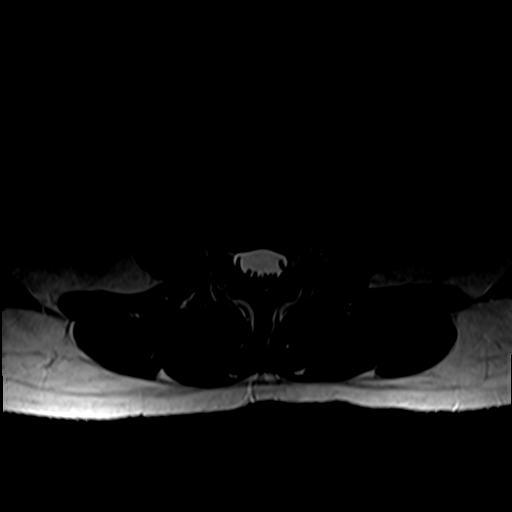
[im 18/35]
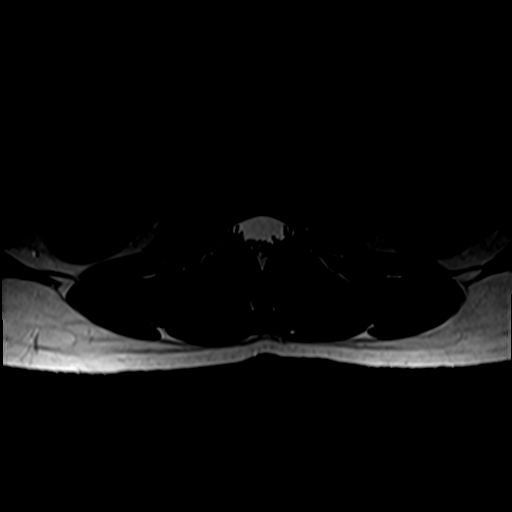
[im 20/35]
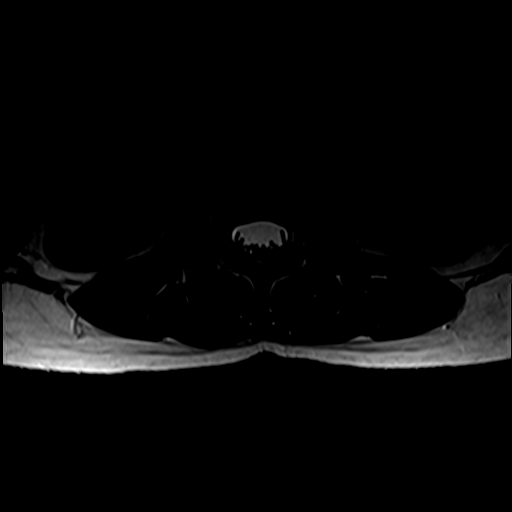
[im 25/35]
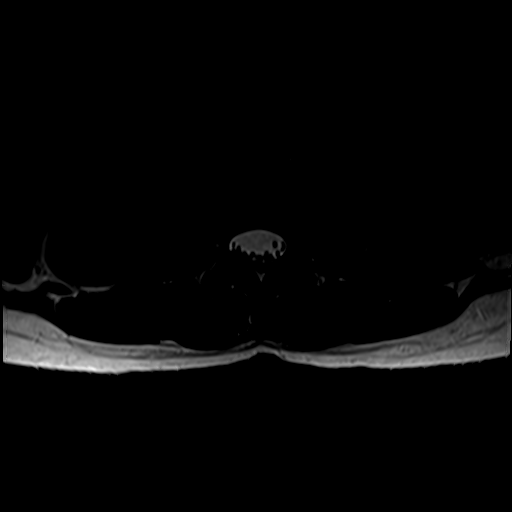
[im 30/35]
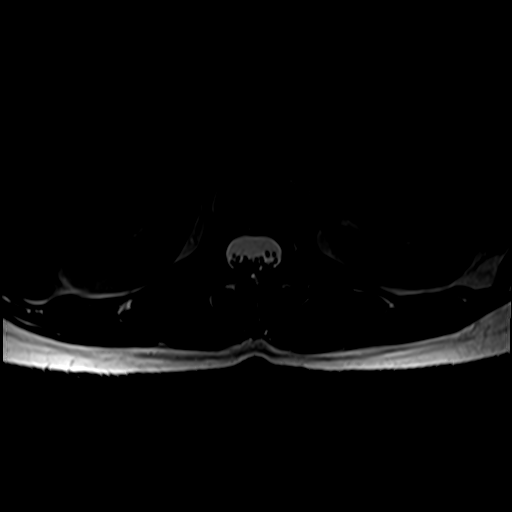
[im 35/35]
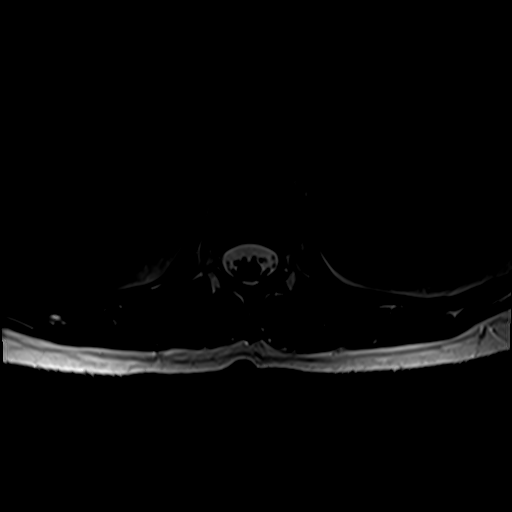

[Series 6: T1 · axial · 4.0mm · 0.39mm/px · z∈[-77,+122]mm · 4 of 35 slices shown (2 of 2)]
[im 1/35]
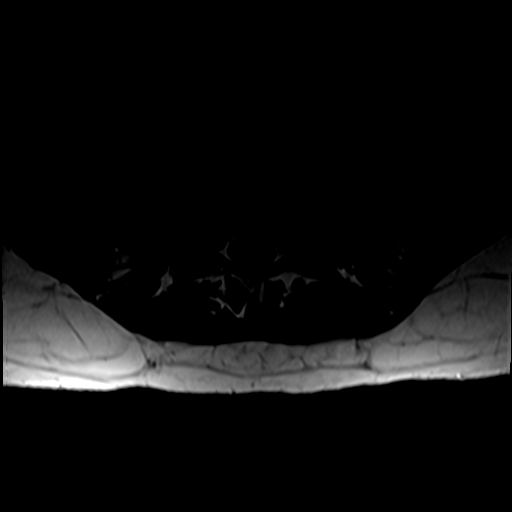
[im 5/35]
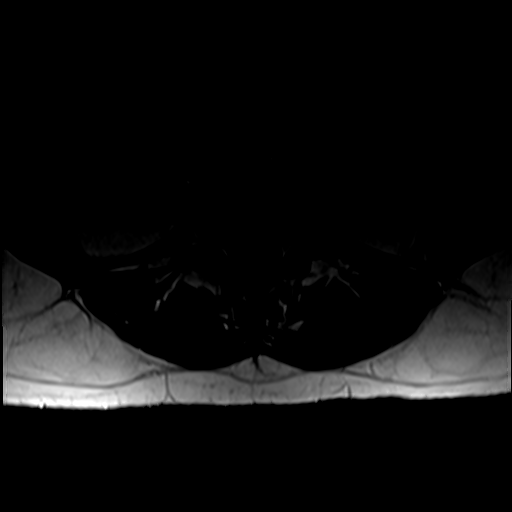
[im 18/35]
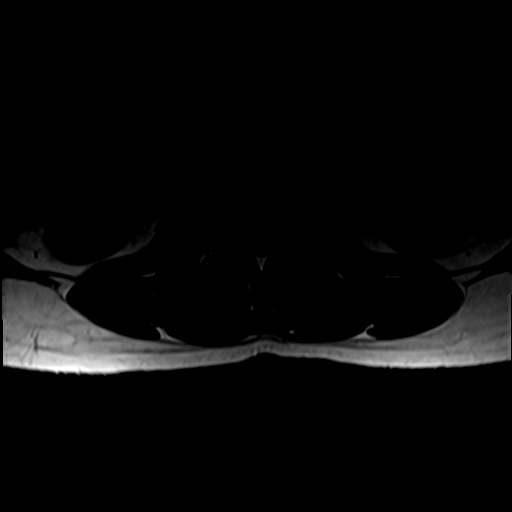
[im 30/35]
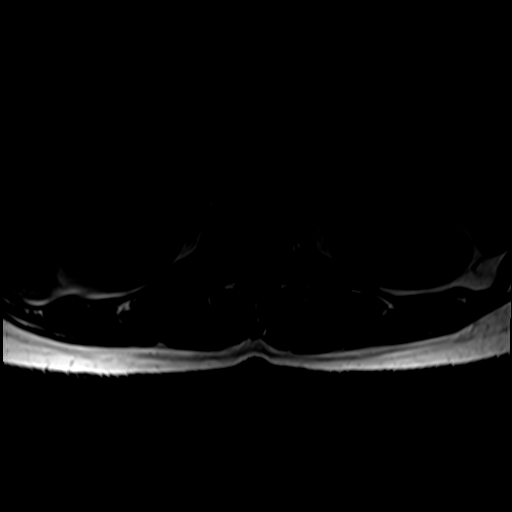

[25 of 48 positions shown; findings below may reference images not displayed]

FINDINGS: Segmentation: Normal. The lowest disc space is considered to be
L5-S1.

Alignment:  Normal

Vertebrae: No acute compression fracture, discitis-osteomyelitis of
focal marrow lesion.

Conus medullaris and cauda equina: The conus medullaris terminates
at the L1 level. The cauda equina and conus medullaris are both
normal.

Paraspinal and other soft tissues: The visualized aorta, IVC and
iliac vessels are normal. The visualized retroperitoneal organs and
paraspinal soft tissues are normal.

Disc levels: Sagittal plane imaging includes the T11-12 disc level
through the upper sacrum, with axial imaging of the T12-S1 disc
levels.

T11-L5 levels are normal.

L5-S1: There is a large left subarticular disc extrusion with
inferior migration that effaces the left lateral recess and
displaces the descending left S1 nerve root.

The visualized portion of the sacrum is normal.
IMPRESSION: Large L5-S1 left subarticular disc extrusion effacing the left
lateral recess and displacing the descending left S1 nerve root.

## 2019-11-19 LAB — HM MAMMOGRAPHY

## 2019-11-28 ENCOUNTER — Ambulatory Visit (INDEPENDENT_AMBULATORY_CARE_PROVIDER_SITE_OTHER): Payer: BC Managed Care – PPO | Admitting: Internal Medicine

## 2019-11-28 ENCOUNTER — Other Ambulatory Visit: Payer: Self-pay

## 2019-11-28 ENCOUNTER — Encounter: Payer: Self-pay | Admitting: Internal Medicine

## 2019-11-28 VITALS — BP 130/90 | HR 72 | Temp 98.4°F | Ht 65.0 in | Wt 134.0 lb

## 2019-11-28 DIAGNOSIS — E538 Deficiency of other specified B group vitamins: Secondary | ICD-10-CM | POA: Diagnosis not present

## 2019-11-28 DIAGNOSIS — E785 Hyperlipidemia, unspecified: Secondary | ICD-10-CM

## 2019-11-28 DIAGNOSIS — Z0000 Encounter for general adult medical examination without abnormal findings: Secondary | ICD-10-CM | POA: Diagnosis not present

## 2019-11-28 DIAGNOSIS — M545 Low back pain, unspecified: Secondary | ICD-10-CM | POA: Diagnosis not present

## 2019-11-28 DIAGNOSIS — Z1159 Encounter for screening for other viral diseases: Secondary | ICD-10-CM | POA: Diagnosis not present

## 2019-11-28 DIAGNOSIS — E559 Vitamin D deficiency, unspecified: Secondary | ICD-10-CM | POA: Diagnosis not present

## 2019-11-28 DIAGNOSIS — M79605 Pain in left leg: Secondary | ICD-10-CM

## 2019-11-28 LAB — HEPATIC FUNCTION PANEL
ALT: 16 U/L (ref 0–35)
AST: 21 U/L (ref 0–37)
Albumin: 4.9 g/dL (ref 3.5–5.2)
Alkaline Phosphatase: 53 U/L (ref 39–117)
Bilirubin, Direct: 0.1 mg/dL (ref 0.0–0.3)
Total Bilirubin: 0.8 mg/dL (ref 0.2–1.2)
Total Protein: 8 g/dL (ref 6.0–8.3)

## 2019-11-28 LAB — URINALYSIS, ROUTINE W REFLEX MICROSCOPIC
Bilirubin Urine: NEGATIVE
Ketones, ur: NEGATIVE
Leukocytes,Ua: NEGATIVE
Nitrite: NEGATIVE
Specific Gravity, Urine: <=1.005 — AB (ref 1.000–1.030)
Total Protein, Urine: NEGATIVE
Urine Glucose: NEGATIVE
Urobilinogen, UA: 0.2 (ref 0.0–1.0)
WBC, UA: NONE SEEN (ref 0–?)
pH: 6.5 (ref 5.0–8.0)

## 2019-11-28 LAB — CBC WITH DIFFERENTIAL/PLATELET
Basophils Absolute: 0.1 10*3/uL (ref 0.0–0.1)
Basophils Relative: 0.9 % (ref 0.0–3.0)
Eosinophils Absolute: 0.1 10*3/uL (ref 0.0–0.7)
Eosinophils Relative: 1.7 % (ref 0.0–5.0)
HCT: 46.2 % — ABNORMAL HIGH (ref 36.0–46.0)
Hemoglobin: 15.4 g/dL — ABNORMAL HIGH (ref 12.0–15.0)
Lymphocytes Relative: 18.5 % (ref 12.0–46.0)
Lymphs Abs: 1.3 10*3/uL (ref 0.7–4.0)
MCHC: 33.4 g/dL (ref 30.0–36.0)
MCV: 93.5 fl (ref 78.0–100.0)
Monocytes Absolute: 0.6 10*3/uL (ref 0.1–1.0)
Monocytes Relative: 8.3 % (ref 3.0–12.0)
Neutro Abs: 4.9 10*3/uL (ref 1.4–7.7)
Neutrophils Relative %: 70.6 % (ref 43.0–77.0)
Platelets: 203 10*3/uL (ref 150.0–400.0)
RBC: 4.94 Mil/uL (ref 3.87–5.11)
RDW: 13.3 % (ref 11.5–15.5)
WBC: 6.9 10*3/uL (ref 4.0–10.5)

## 2019-11-28 LAB — VITAMIN D 25 HYDROXY (VIT D DEFICIENCY, FRACTURES): VITD: 47.13 ng/mL (ref 30.00–100.00)

## 2019-11-28 LAB — BASIC METABOLIC PANEL
BUN: 16 mg/dL (ref 6–23)
CO2: 25 mEq/L (ref 19–32)
Calcium: 9.9 mg/dL (ref 8.4–10.5)
Chloride: 104 mEq/L (ref 96–112)
Creatinine, Ser: 0.96 mg/dL (ref 0.40–1.20)
GFR: 71.98 mL/min (ref >60.00)
Glucose, Bld: 82 mg/dL (ref 70–99)
Potassium: 4.1 mEq/L (ref 3.5–5.1)
Sodium: 139 mEq/L (ref 135–145)

## 2019-11-28 LAB — TSH: TSH: 1.22 u[IU]/mL (ref 0.35–4.50)

## 2019-11-28 LAB — VITAMIN B12: Vitamin B-12: 458 pg/mL (ref 211–911)

## 2019-11-28 MED ORDER — MELOXICAM 15 MG PO TABS: 15.0000 mg | ORAL_TABLET | Freq: Every day | ORAL | 3 refills | Status: DC | PRN

## 2019-11-28 NOTE — Progress Notes (Signed)
Subjective:    Patient ID: Carolyn Hancock, female    DOB: 12-Mar-1975, 44 y.o.   MRN: [id number]  HPI  Here for wellness and f/u;  Overall doing ok;  Pt denies Chest pain, worsening SOB, DOE, wheezing, orthopnea, PND, worsening LE edema, palpitations, dizziness or syncope.  Pt denies neurological change such as new headache, facial or extremity weakness.  Pt denies polydipsia, polyuria, or low sugar symptoms. Pt states overall good compliance with treatment and medications, good tolerability, and has been trying to follow appropriate diet.  Pt denies worsening depressive symptoms, suicidal ideation or panic. No fever, night sweats, wt loss, loss of appetite, or other constitutional symptoms.  Pt states good ability with ADL's, has low fall risk, home safety reviewed and adequate, no other significant changes in hearing or vision, and only occasionally active with exercise.  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  ALso states her left LBP has been very mildly increased recently, but without radicular symptoms or LLE weakness.  Has hx over 5 yrs ago with known lumbar disc disease by MRI per ortho and almost had surgury, but she improved.  She in fact has had no symptoms with not going to the gym at all for 18 mo during covid, but now has started back, wondering if she can take the mobic more often, as this seems to work for now Past Medical History:  Diagnosis Date  . Anemia, unspecified 06/07/2011  . History of abnormal Pap smear 07/28/2011   1997; repeats normal since  . Hyperlipidemia 06/03/2011  . Pelvic floor dysfunction 05/04/2010   Resolved; 4/5 Kegel and no UIC at [facility]/pap on 67/13  . Penicillin allergy 07/28/2011   rash  . Pre-conception counseling 07/22/2011   Past Surgical History:  Procedure Laterality Date  . TYMPANOSTOMY TUBE PLACEMENT    . WISDOM TOOTH EXTRACTION      reports that she has never smoked. She has never used smokeless tobacco. She reports  that she does not drink alcohol and does not use drugs. family history includes Heart failure in her paternal grandmother; Hyperlipidemia in her father and mother; Hypertension in her father and mother. Allergies  Allergen Reactions  . Penicillins Rash   No current outpatient medications on file prior to visit.   No current facility-administered medications on file prior to visit.   Review of Systems All otherwise neg per pt     Objective:   Physical Exam BP 130/90 (BP Location: Left Arm, Patient Position: Sitting, Cuff Size: Large)   Pulse 72   Temp 98.4 F (36.9 C) (Oral)   Ht 5\' 5"  (1.651 m)   Wt 134 lb (60.8 kg)   SpO2 98%   BMI 22.30 kg/m  VS noted,  Constitutional: Pt appears in NAD HENT: Head: NCAT.  Right Ear: External ear normal.  Left Ear: External ear normal.  Eyes: . Pupils are equal, round, and reactive to light. Conjunctivae and EOM are normal Nose: without d/c or deformity Neck: Neck supple. Gross normal ROM Cardiovascular: Normal rate and regular rhythm.   Pulmonary/Chest: Effort normal and breath sounds without rales or wheezing.  Abd:  Soft, NT, ND, + BS, no organomegaly Neurological: Pt is alert. At baseline orientation, motor grossly intact Skin: Skin is warm. No rashes, other new lesions, no LE edema Psychiatric: Pt behavior is normal without agitation  All otherwise neg per pt Lab Results  Component Value Date   WBC 5.5 10/31/2013   HGB  15.1 (H) 10/31/2013   HCT 44.5 10/31/2013   PLT 207.0 10/31/2013   GLUCOSE 80 10/31/2013   CHOL 189 10/31/2013   TRIG 131.0 10/31/2013   HDL 54.00 10/31/2013   LDLDIRECT 151.1 04/16/2010   LDLCALC 109 (H) 10/31/2013   ALT 26 10/31/2013   AST 20 10/31/2013   NA 136 10/31/2013   K 3.9 10/31/2013   CL 102 10/31/2013   CREATININE 0.9 10/31/2013   BUN 17 10/31/2013   CO2 27 10/31/2013   TSH 1.51 10/31/2013   INR 0.92 03/28/2010      Assessment & Plan:

## 2019-11-28 NOTE — Assessment & Plan Note (Signed)

## 2019-11-28 NOTE — Assessment & Plan Note (Signed)
Mild recurrence recently with Left LBP only, no radicular symptoms, for mobic prn,

## 2019-11-28 NOTE — Assessment & Plan Note (Signed)
Recent lipid panel from work normal, improved from LDL mild elveated 2015; to continue diet

## 2019-11-28 NOTE — Patient Instructions (Signed)
Please continue all other medications as before, and refills have been done if requested - [facility]  You can also take an OTC prilosec with it to reduce even the small chance of stomach irritation with taking the mobic fairly often  Please check your BP on a regular basis at home, with the goal being to be at least less than 140/90 (or better would be less than 130/80)  Please have the pharmacy call with any other refills you may need.  Please continue your efforts at being more active, low cholesterol diet, and weight control.  You are otherwise up to date with prevention measures today.  Please keep your appointments with your specialists as you may have planned  Please go to the LAB at the blood drawing area for the tests to be done  You will be contacted by phone if any changes need to be made immediately.  Otherwise, you will receive a letter about your results with an explanation, but please check with MyChart first.  Please remember to sign up for MyChart if you have not done so, as this will be important to you in the future with finding out test results, communicating by private email, and scheduling acute appointments online when needed.  Please make an Appointment to return for your 1 year visit, or sooner if needed

## 2019-11-29 LAB — HEPATITIS C ANTIBODY
Hepatitis C Ab: NONREACTIVE
SIGNAL TO CUT-OFF: 0.01 (ref <1.00)

## 2020-05-21 ENCOUNTER — Emergency Department (HOSPITAL_COMMUNITY)
Admission: EM | Admit: 2020-05-21 | Discharge: 2020-05-21 | Disposition: A | Discharge status: Home / Self Care | Payer: BC Managed Care – PPO | Attending: Emergency Medicine | Admitting: Emergency Medicine

## 2020-05-21 DIAGNOSIS — M545 Low back pain, unspecified: Secondary | ICD-10-CM | POA: Diagnosis present

## 2020-05-21 DIAGNOSIS — M5416 Radiculopathy, lumbar region: Secondary | ICD-10-CM | POA: Diagnosis not present

## 2020-05-21 DIAGNOSIS — G8929 Other chronic pain: Secondary | ICD-10-CM | POA: Insufficient documentation

## 2020-05-21 LAB — I-STAT BETA HCG BLOOD, ED (MC, WL, AP ONLY): I-stat hCG, quantitative: <5 m[IU]/mL (ref <5)

## 2020-05-21 MED ORDER — ONDANSETRON 4 MG PO TBDP
4.0000 mg | ORAL_TABLET | Freq: Once | ORAL | Status: AC
  Administered 2020-05-21: 4 mg via ORAL
  Filled 2020-05-21: qty 1

## 2020-05-21 MED ORDER — OXYCODONE-ACETAMINOPHEN 5-325 MG PO TABS
1.0000 | ORAL_TABLET | Freq: Once | ORAL | Status: AC
  Administered 2020-05-21: 1 via ORAL
  Filled 2020-05-21: qty 1

## 2020-05-21 MED ORDER — HYDROCODONE-ACETAMINOPHEN 5-325 MG PO TABS: 1.0000 | ORAL_TABLET | Freq: Four times a day (QID) | ORAL | 0 refills | Status: DC | PRN

## 2020-05-21 MED ORDER — CYCLOBENZAPRINE HCL 10 MG PO TABS: 10.0000 mg | ORAL_TABLET | Freq: Two times a day (BID) | ORAL | 0 refills | Status: DC | PRN

## 2020-05-21 MED ORDER — KETOROLAC TROMETHAMINE 15 MG/ML IJ SOLN
30.0000 mg | Freq: Once | INTRAMUSCULAR | Status: AC
  Administered 2020-05-21: 30 mg via INTRAVENOUS
  Filled 2020-05-21: qty 2

## 2020-05-21 MED ORDER — CYCLOBENZAPRINE HCL 10 MG PO TABS
10.0000 mg | ORAL_TABLET | Freq: Once | ORAL | Status: AC
  Administered 2020-05-21: 10 mg via ORAL
  Filled 2020-05-21: qty 1

## 2020-05-21 NOTE — Discharge Instructions (Addendum)
In addition to your prednisone taper, please also resume meloxicam daily.  You were given a prescription for Flexeril which is a muscle relaxer.  You should not drive, work, consume alcohol, or operate machinery while taking this medication as it can make you very drowsy.    The Flexeril should help with your symptoms, particularly at night.  Please understand your significant drowsy.  I have also prescribed you Vicodin which you can take as needed for breakthrough pain, but I would like for you to not take it with your Flexeril.  You need close outpatient follow-up with your orthopedist.  They may want to schedule outpatient MRI.  Return to the ED or seek immediate medical attention should you experience any new or worsening symptoms.

## 2020-05-21 NOTE — ED Notes (Signed)
Patient Alert and oriented to baseline. Stable and ambulatory to baseline. Patient verbalized understanding of the discharge instructions.  Patient belongings were taken by the patient.   

## 2020-05-21 NOTE — ED Notes (Signed)
Pt provided with crackers and ginger ale per request.

## 2020-05-21 NOTE — ED Triage Notes (Addendum)
Emergency Medicine Provider Triage Evaluation Note  Carolyn Hancock , a 45 y.o. female  was evaluated in triage.  Pt complains of pain shooting down her L lateral thigh. Hx of L5/S1 disc extrusion on MRI in 2019. Has ongoing radiculopathy that acutely worsened tonight. Had difficulty moving her LLE to get out of bed. Also had trouble voiding PTA, but was eventually able to do so. No bowel or bladder incontinence, fever, genital or perianal numbness. Just completed a prednisone taper for symptoms. Due for epidural injection on 06/04/20.  Review of Systems  Positive: Back pain, leg pain Negative: Incontinence, fever, numbness  Physical Exam  BP (!) 128/91 (BP Location: Right Arm)   Pulse 67   Temp (!) 97.5 F (36.4 C) (Oral)   Resp 20   Ht 5\' 5"  (1.651 m)   Wt 60.8 kg   SpO2 100%   BMI 22.31 kg/m  Gen:   Awake, no distress   HEENT:  Atraumatic  Resp:  Normal effort  Cardiac:  Normal rate  Abd:   Nondistended, nontender  MSK:   Moves extremities without difficulty. 5/5 strength against   resistance of BLE with hip flexion, abduction, adduction, knee   Extension. 5/5 strength with plantar flexion and dorsiflexion   against resistance. Neuro:  Speech clear. Sensation intact, equal. Ambulatory in triage  Medical Decision Making  Medically screening exam initiated at 3:57 AM.  Appropriate orders placed.  Sonni Barse was informed that the remainder of the evaluation will be completed by another provider, this initial triage assessment does not replace that evaluation, and the importance of remaining in the ED until their evaluation is complete.  Clinical Impression  Acute on chronic radiculopathy   Antonietta Breach, PA-C 05/21/20 0409    Antonietta Breach, PA-C 05/21/20 [phone]

## 2020-05-21 NOTE — ED Provider Notes (Signed)
[facility] EMERGENCY DEPARTMENT Provider Note   CSN: [id number] Arrival date & time: 05/21/20  9211     History Chief Complaint  Patient presents with  . Back Pain    Carolyn Hancock is a 45 y.o. 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  On my examination, patient reports that for the past 2 weeks, she has been experiencing difficulty even sitting down.  She saw her orthopedist at [facility] last week, they prescribed her prednisone taper.  They had her on meloxicam previously, but insisted that she hold the meloxicam in lieu of the prednisone taper.  She reports that she has been doing with her low back pain for years, but had her discectomy postponed due to COVID-19.  She states that Dr. Rolena Infante is her orthopedist.  She has a scheduled nerve block procedure in 2 weeks, but does not feel as though she will be able to wait that long.  She believes that she may be due for repeat MRI given her worsening symptoms.  She states that she was having difficulty sitting on the toilet earlier today due to pain, but is not having any incontinence or true retention.  Her primary complaint is pain down the back of her left leg to the level of the knee.  Her husband drove her here to the ED for evaluation.  She denies any possibility of pregnancy.  She also denies any fevers or chills, history of malignancy, numbness, weakness, history of IVDA, recent trauma, or other symptoms.  HPI     Past Medical History:  Diagnosis Date  . Anemia, unspecified 06/07/2011  . History of abnormal Pap smear 07/28/2011   1997; repeats normal since  . Hyperlipidemia 06/03/2011  . Pelvic floor dysfunction 05/04/2010   Resolved; 4/5 Kegel and no UIC at Aex/pap on 67/13  . Penicillin allergy 07/28/2011   rash  . Pre-conception counseling 07/22/2011    Patient Active Problem  List   Diagnosis Date Noted  . Low back pain radiating to left leg 11/20/2017  . Preventative health care 10/02/2012  . Hx of thrombocytopenia in previous pregnancy 10/28/2011  . Vaginal bleeding in 1st trimester 10/28/2011  . History of abnormal Pap smear 07/28/2011  . Hx of condyloma acuminatum 07/28/2011  . Penicillin allergy 07/28/2011  . Anemia, unspecified 06/07/2011  . Hyperlipidemia 06/03/2011  . Pelvic floor dysfunction 05/04/2010    Past Surgical History:  Procedure Laterality Date  . TYMPANOSTOMY TUBE PLACEMENT    . WISDOM TOOTH EXTRACTION       OB History    Gravida  2   Para  1   Term  1   Preterm      AB      Living  1     SAB      IAB      Ectopic      Multiple      Live Births              Family History  Problem Relation Age of Onset  . Hyperlipidemia Mother   . Hypertension Mother   . Hyperlipidemia Father   . Hypertension Father   . Heart failure Paternal Grandmother     Social History   Tobacco Use  . Smoking status: Never Smoker  . Smokeless tobacco: Never Used  Substance Use Topics  . Alcohol use: No  .  Drug use: No    Home Medications Prior to Admission medications   Medication Sig Start Date End Date Taking? Authorizing Provider  cyclobenzaprine (FLEXERIL) 10 MG tablet Take 1 tablet (10 mg total) by mouth 2 (two) times daily as needed for muscle spasms. 05/21/20  Yes Corena Herter, PA-C  HYDROcodone-acetaminophen (NORCO/VICODIN) 5-325 MG tablet Take 1 tablet by mouth every 6 (six) hours as needed for severe pain. 05/21/20  Yes Corena Herter, PA-C  meloxicam (MOBIC) 15 MG tablet Take 1 tablet (15 mg total) by mouth daily as needed for pain. 11/28/19   Biagio Borg, MD    Allergies    Penicillins  Review of Systems   Review of Systems  All other systems reviewed and are negative.   Physical Exam Updated Vital Signs BP (!) 128/91 (BP Location: Right Arm)   Pulse 67   Temp (!) 97.5 F (36.4 C) (Oral)    Resp 20   Ht 5\' 5"  (1.651 m)   Wt 60.8 kg   SpO2 100%   BMI 22.31 kg/m   Physical Exam Vitals and nursing note reviewed. Exam conducted with a chaperone present.  Constitutional:      General: She is not in acute distress.    Appearance: She is not toxic-appearing.     Comments: Lying prone position.  In discomfort.  HENT:     Head: Normocephalic and atraumatic.  Eyes:     General: No scleral icterus.    Conjunctiva/sclera: Conjunctivae normal.  Cardiovascular:     Rate and Rhythm: Normal rate.     Pulses: Normal pulses.  Pulmonary:     Effort: Pulmonary effort is normal. No respiratory distress.  Abdominal:     General: Abdomen is flat. There is no distension.     Palpations: Abdomen is soft.     Tenderness: There is no abdominal tenderness.     Comments: No suprapubic distention.  Musculoskeletal:     Comments: No midline spinal tenderness to palpation.  No overlying masses or erythema.  Skin:    General: Skin is dry.  Neurological:     General: No focal deficit present.     Mental Status: She is alert and oriented to person, place, and time.     GCS: GCS eye subscore is 4. GCS verbal subscore is 5. GCS motor subscore is 6.     Cranial Nerves: No cranial nerve deficit.     Sensory: No sensory deficit.     Motor: No weakness.     Coordination: Coordination normal.  Psychiatric:        Mood and Affect: Mood normal.        Behavior: Behavior normal.        Thought Content: Thought content normal.     ED Results / Procedures / Treatments   Labs (all labs ordered are listed, but only abnormal results are displayed) Labs Reviewed  I-STAT BETA HCG BLOOD, ED ([facility], [facility], [facility] ONLY)    EKG None  Radiology No results found.  Procedures Procedures   Medications Ordered in ED Medications  oxyCODONE-acetaminophen (PERCOCET/ROXICET) 5-325 MG per tablet 1 tablet (1 tablet Oral Given 05/21/20 0403)  ondansetron (ZOFRAN-ODT) disintegrating tablet 4 mg (4 mg Oral Given  05/21/20 0403)  ketorolac (TORADOL) 15 MG/ML injection 30 mg (30 mg Intravenous Given 05/21/20 0700)  cyclobenzaprine (FLEXERIL) tablet 10 mg (10 mg Oral Given 05/21/20 0701)    ED Course  I have reviewed the triage vital signs and the nursing  notes.  Pertinent labs & imaging results that were available during my care of the patient were reviewed by me and considered in my medical decision making (see chart for details).    MDM Rules/Calculators/A&P                          Leana Springston was evaluated in Emergency Department on 05/21/2020 for the symptoms described in the history of present illness. She was evaluated in the context of the global COVID-19 pandemic, which necessitated consideration that the patient might be at risk for infection with the SARS-CoV-2 virus that causes COVID-19. Institutional protocols and algorithms that pertain to the evaluation of patients at risk for COVID-19 are in a state of rapid change based on information released by regulatory bodies including the CDC and federal and state organizations. These policies and algorithms were followed during the patient's care in the ED.  I personally reviewed patient's medical chart and all notes from triage and staff during today's encounter. I have also ordered and reviewed all labs and imaging that I felt to be medically necessary in the evaluation of this patient's complaints and with consideration of their physical exam. If needed, translation services were available and utilized.   Patient with acute on chronic lumbar region back pain with left-sided radiculopathy.  She states that her radicular symptoms have been disruptive to her day-to-day activities and cannot bear her discomfort any longer.  She is followed by Dr. Rolena Infante with [facility].  She saw them last week, but prescription of prednisone has been adequate for management of her symptoms.  We will treat with Toradol and Flexeril here in the ED.  She is negative for  pregnancy.  Anticipate discharge home with continued Flexeril and NSAIDs in addition to her prednisone taper.  Recommending close outpatient follow-up +/-repeat MRI ordered by her orthopedist for ongoing management.  She has failed outpatient therapy including physical therapy in the past.  She may ultimately require discectomy for definitive intervention.  No red flags concerning patient's back pain. No s/s of central cord compression or cauda equina. Lower extremities are neurovascularly intact and patient is ambulating without difficulty.  On subsequent evaluation, patient is feeling improved after Flexeril and Toradol administration.  She had positive left-sided SLR with radiation into her left quadriceps region.  Her sensation is intact and symmetric bilaterally.  There is no suprapubic tenderness or concern for urinary retention or incontinence.  He was able to move her legs and states that she has no difficulty walking.  In fact, her symptoms improved with standing and ambulation.    In addition to the Flexeril, I will also discharge her home with a short course of Vicodin to take as needed for breakthrough pain.  However, she intends to treat her symptoms with the prednisone taper and meloxicam that she has already at home in addition to Flexeril and light stretching exercises.  She suspects that her symptoms were due to her increased activity the past couple of weeks.  ED return precautions discussed.  Patient voices understanding and is agreeable to the plan.  Final Clinical Impression(s) / ED Diagnoses Final diagnoses:  Lumbar back pain with radiculopathy affecting left lower extremity    Rx / DC Orders ED Discharge Orders         Ordered    cyclobenzaprine (FLEXERIL) 10 MG tablet  2 times daily PRN        05/21/20 0751    HYDROcodone-acetaminophen (NORCO/VICODIN)  5-325 MG tablet  Every 6 hours PRN        05/21/20 0751           Corena Herter, PA-C 05/21/20 7185     Fredia Sorrow, MD 05/21/20 [phone]

## 2020-05-21 NOTE — ED Triage Notes (Signed)
Pt presents to the ED with chronic back pain for years (herniated disc). Pt states she sees a neuro doctor outpatient and was prescribed robaxin and a steroid pack (finished yesterday). Pt states she was unable to urinate this morning, but eventually was able to urinate. Pain radiates down left leg. Pt ambulatory with steady gait in triage.

## 2020-06-02 ENCOUNTER — Encounter: Payer: Self-pay | Admitting: Internal Medicine

## 2020-06-02 ENCOUNTER — Other Ambulatory Visit: Payer: Self-pay

## 2020-06-02 ENCOUNTER — Ambulatory Visit (INDEPENDENT_AMBULATORY_CARE_PROVIDER_SITE_OTHER): Payer: BC Managed Care – PPO | Admitting: Internal Medicine

## 2020-06-02 VITALS — BP 120/72 | HR 122 | Temp 98.7°F | Ht 65.0 in | Wt 120.8 lb

## 2020-06-02 DIAGNOSIS — R000 Tachycardia, unspecified: Secondary | ICD-10-CM | POA: Diagnosis not present

## 2020-06-02 DIAGNOSIS — Z01818 Encounter for other preprocedural examination: Secondary | ICD-10-CM | POA: Diagnosis not present

## 2020-06-02 NOTE — Progress Notes (Signed)
Patient ID: Carolyn Hancock, female   DOB: [birth year], 45 y.o.   MRN: [id number]        Chief Complaint: preop clearance for worsening LLE radiculopathy       HPI:  Carolyn Hancock is a 45 y.o. female here with c/o acute on chronic LLE radicular pain, felt due to worsening lumbar disc herniation impacting the left S1;  Began 3 yrs ago after slid accidentally down some stairs and has been mild intermittent symptomatic since 2019; unfortunately in the past month it has become severe, constant and pain not well controlled with gabapentin and muscle relaxer.  Pt literally cannot sit down at all except for maximum 2-3 min with medication use.  Unable to drive.  Has seen Dr Rolena Infante and has surgical intervention planned for apr 29.  Pt overall o/w doing well but has noticed on her health app her HR has been elevated since onset pain for which she is asymptomatic and Pt denies chest pain, increased sob or doe, wheezing, orthopnea, PND, increased LE swelling, palpitations, dizziness or syncope. Denies new worsening focal neuro s/s.   Pt denies polydipsia, polyuria,  Pt denies fever, wt loss, night sweats, loss of appetite, or other constitutional symptoms  Has no other new complaints  Wt Readings from Last 3 Encounters:  06/02/20 120 lb 12.8 oz (54.8 kg)  05/21/20 134 lb 0.6 oz (60.8 kg)  11/28/19 134 lb (60.8 kg)   BP Readings from Last 3 Encounters:  06/02/20 120/72  05/21/20 138/83  11/28/19 130/90         Past Medical History:  Diagnosis Date  . Anemia, unspecified 06/07/2011  . History of abnormal Pap smear 07/28/2011   1997; repeats normal since  . Hyperlipidemia 06/03/2011  . Pelvic floor dysfunction 05/04/2010   Resolved; 4/5 Kegel and no UIC at A[facility]/pap on 67/13  . Penicillin allergy 07/28/2011   rash  . Pre-conception counseling 07/22/2011   Past Surgical History:  Procedure Laterality Date  . TYMPANOSTOMY TUBE PLACEMENT    . WISDOM TOOTH EXTRACTION      reports that she has never smoked.  She has never used smokeless tobacco. She reports that she does not drink alcohol and does not use drugs. family history includes Heart failure in her paternal grandmother; Hyperlipidemia in her father and mother; Hypertension in her father and mother. Allergies  Allergen Reactions  . Penicillins Rash   Current Outpatient Medications on File Prior to Visit  Medication Sig Dispense Refill  . cyclobenzaprine (FLEXERIL) 10 MG tablet Take 1 tablet (10 mg total) by mouth 2 (two) times daily as needed for muscle spasms. (Patient taking differently: Take 10 mg by mouth 3 (three) times daily.) 20 tablet 0  . gabapentin (NEURONTIN) 300 MG capsule gabapentin 300 mg capsule  TAKE 1 CAPSULE BY MOUTH THREE TIMES DAILY AS DIRECTED    . HYDROcodone-acetaminophen (NORCO/VICODIN) 5-325 MG tablet Take 1 tablet by mouth every 6 (six) hours as needed for severe pain. (Patient not taking: Reported on 06/02/2020) 6 tablet 0  . meloxicam (MOBIC) 15 MG tablet Take 1 tablet (15 mg total) by mouth daily as needed for pain. (Patient not taking: Reported on 06/02/2020) 90 tablet 3   No current facility-administered medications on file prior to visit.        ROS:  All others reviewed and negative.  Objective        PE:  BP 120/72 (BP Location: Right Arm, Patient Position: Standing, Cuff Size: Normal)   Pulse (!) 122  Temp 98.7 F (37.1 C) (Oral)   Ht 5\' 5"  (1.651 m)   Wt 120 lb 12.8 oz (54.8 kg)   LMP 06/01/2020   SpO2 98%   BMI 20.10 kg/m                 Constitutional: Pt appears in NAD               HENT: Head: NCAT.                Right Ear: External ear normal.                 Left Ear: External ear normal.                Eyes: . Pupils are equal, round, and reactive to light. Conjunctivae and EOM are normal               Nose: without d/c or deformity               Neck: Neck supple. Gross normal ROM               Cardiovascular: Normal rate and regular rhythm.  But mild tachycardic                Pulmonary/Chest: Effort normal and breath sounds without rales or wheezing.                Abd:  Soft, NT, ND, + BS, no organomegaly               Neurological: Pt is alert. At baseline orientation, motor grossly intact               Skin: Skin is warm. No rashes, no other new lesions, LE edema - none               Psychiatric: Pt behavior is normal without agitation   Micro: none  Cardiac tracings I have personally interpreted today:  none  Pertinent Radiological findings (summarize): none   Lab Results  Component Value Date   WBC 6.9 11/28/2019   HGB 15.4 (H) 11/28/2019   HCT 46.2 (H) 11/28/2019   PLT 203.0 11/28/2019   GLUCOSE 82 11/28/2019   CHOL 189 10/31/2013   TRIG 131.0 10/31/2013   HDL 54.00 10/31/2013   LDLDIRECT 151.1 04/16/2010   LDLCALC 109 (H) 10/31/2013   ALT 16 11/28/2019   AST 21 11/28/2019   NA 139 11/28/2019   K 4.1 11/28/2019   CL 104 11/28/2019   CREATININE 0.96 11/28/2019   BUN 16 11/28/2019   CO2 25 11/28/2019   TSH 1.22 11/28/2019   INR 0.92 03/28/2010   Assessment/Plan:  Carolyn Hancock is a 45 y.o. White or Caucasian [1] female with  has a past medical history of Anemia, unspecified (06/07/2011), History of abnormal Pap smear (07/28/2011), Hyperlipidemia (06/03/2011), Pelvic floor dysfunction (05/04/2010), Penicillin allergy (07/28/2011), and Pre-conception counseling (07/22/2011).  Tachycardia Regular today, c/w likely hyperdynamism due to pain and doubt other significant issue such as primary arrythmia; for increased fluids, rest, pain control and should not preclude surgical intervention; pt decline ecg as will have with preop anesthesia evaluation as per routine  Preop exam for internal medicine Pt o/w cleared for surgury as planned per orrhopedic, pt has stopped he mobic already and has no other specific med changes or other reommendations at this time, form signed to fax   Followup: Return if symptoms worsen or fail to improve.  Jeneen Rinks  Jenny Reichmann, MD  06/02/2020 1:03 PM [facility]

## 2020-06-02 NOTE — Assessment & Plan Note (Signed)
Regular today, c/w likely hyperdynamism due to pain and doubt other significant issue such as primary arrythmia; for increased fluids, rest, pain control and should not preclude surgical intervention; pt decline ecg as will have with preop anesthesia evaluation as per routine

## 2020-06-02 NOTE — Assessment & Plan Note (Addendum)
Pt o/w cleared for surgury as planned per orrhopedic, pt has stopped he mobic already and has no other specific med changes or other reommendations at this time, form signed to fax

## 2020-06-02 NOTE — Patient Instructions (Signed)
Your form will be faxed

## 2021-01-07 ENCOUNTER — Other Ambulatory Visit: Payer: Self-pay | Admitting: Internal Medicine

## 2021-09-28 ENCOUNTER — Other Ambulatory Visit (INDEPENDENT_AMBULATORY_CARE_PROVIDER_SITE_OTHER): Payer: BC Managed Care – PPO

## 2021-09-28 ENCOUNTER — Other Ambulatory Visit: Payer: Self-pay | Admitting: Internal Medicine

## 2021-09-28 DIAGNOSIS — E559 Vitamin D deficiency, unspecified: Secondary | ICD-10-CM | POA: Diagnosis not present

## 2021-09-28 DIAGNOSIS — E785 Hyperlipidemia, unspecified: Secondary | ICD-10-CM

## 2021-09-28 DIAGNOSIS — E538 Deficiency of other specified B group vitamins: Secondary | ICD-10-CM | POA: Diagnosis not present

## 2021-09-28 DIAGNOSIS — R739 Hyperglycemia, unspecified: Secondary | ICD-10-CM

## 2021-09-28 DIAGNOSIS — M545 Low back pain, unspecified: Secondary | ICD-10-CM

## 2021-09-28 LAB — URINALYSIS, ROUTINE W REFLEX MICROSCOPIC
Bilirubin Urine: NEGATIVE
Ketones, ur: NEGATIVE
Leukocytes,Ua: NEGATIVE
Nitrite: NEGATIVE
Specific Gravity, Urine: 1.02 (ref 1.000–1.030)
Total Protein, Urine: NEGATIVE
Urine Glucose: NEGATIVE
Urobilinogen, UA: 0.2 (ref 0.0–1.0)
pH: 5.5 (ref 5.0–8.0)

## 2021-09-28 LAB — CBC WITH DIFFERENTIAL/PLATELET
Basophils Absolute: 0.1 10*3/uL (ref 0.0–0.1)
Basophils Relative: 1.1 % (ref 0.0–3.0)
Eosinophils Absolute: 0.1 10*3/uL (ref 0.0–0.7)
Eosinophils Relative: 2.6 % (ref 0.0–5.0)
HCT: 45.1 % (ref 36.0–46.0)
Hemoglobin: 15 g/dL (ref 12.0–15.0)
Lymphocytes Relative: 25.7 % (ref 12.0–46.0)
Lymphs Abs: 1.4 10*3/uL (ref 0.7–4.0)
MCHC: 33.3 g/dL (ref 30.0–36.0)
MCV: 93.3 fl (ref 78.0–100.0)
Monocytes Absolute: 0.5 10*3/uL (ref 0.1–1.0)
Monocytes Relative: 10 % (ref 3.0–12.0)
Neutro Abs: 3.3 10*3/uL (ref 1.4–7.7)
Neutrophils Relative %: 60.6 % (ref 43.0–77.0)
Platelets: 211 10*3/uL (ref 150.0–400.0)
RBC: 4.83 Mil/uL (ref 3.87–5.11)
RDW: 13 % (ref 11.5–15.5)
WBC: 5.4 10*3/uL (ref 4.0–10.5)

## 2021-09-28 LAB — BASIC METABOLIC PANEL
BUN: 12 mg/dL (ref 6–23)
CO2: 23 mEq/L (ref 19–32)
Calcium: 9.1 mg/dL (ref 8.4–10.5)
Chloride: 102 mEq/L (ref 96–112)
Creatinine, Ser: 0.82 mg/dL (ref 0.40–1.20)
GFR: 86.2 mL/min (ref >60.00)
Glucose, Bld: 85 mg/dL (ref 70–99)
Potassium: 3.9 mEq/L (ref 3.5–5.1)
Sodium: 138 mEq/L (ref 135–145)

## 2021-09-28 LAB — HEPATIC FUNCTION PANEL
ALT: 14 U/L (ref 0–35)
AST: 20 U/L (ref 0–37)
Albumin: 4.4 g/dL (ref 3.5–5.2)
Alkaline Phosphatase: 72 U/L (ref 39–117)
Bilirubin, Direct: 0.1 mg/dL (ref 0.0–0.3)
Total Bilirubin: 0.5 mg/dL (ref 0.2–1.2)
Total Protein: 7.4 g/dL (ref 6.0–8.3)

## 2021-09-28 LAB — VITAMIN B12: Vitamin B-12: 520 pg/mL (ref 211–911)

## 2021-09-28 LAB — HEMOGLOBIN A1C: Hgb A1c MFr Bld: 5.4 % (ref 4.6–6.5)

## 2021-09-28 LAB — LIPID PANEL
Cholesterol: 205 mg/dL — ABNORMAL HIGH (ref 0–200)
HDL: 55.2 mg/dL (ref >39.00)
LDL Cholesterol: 125 mg/dL — ABNORMAL HIGH (ref 0–99)
NonHDL: 149.64
Total CHOL/HDL Ratio: 4
Triglycerides: 121 mg/dL (ref 0.0–149.0)
VLDL: 24.2 mg/dL (ref 0.0–40.0)

## 2021-09-28 LAB — TSH: TSH: 1.99 u[IU]/mL (ref 0.35–5.50)

## 2021-09-28 LAB — VITAMIN D 25 HYDROXY (VIT D DEFICIENCY, FRACTURES): VITD: 27.43 ng/mL — ABNORMAL LOW (ref 30.00–100.00)

## 2021-09-29 ENCOUNTER — Ambulatory Visit (INDEPENDENT_AMBULATORY_CARE_PROVIDER_SITE_OTHER): Payer: BC Managed Care – PPO | Admitting: Internal Medicine

## 2021-09-29 VITALS — BP 120/74 | HR 80 | Temp 98.1°F | Ht 65.0 in | Wt 142.0 lb

## 2021-09-29 DIAGNOSIS — M5442 Lumbago with sciatica, left side: Secondary | ICD-10-CM

## 2021-09-29 DIAGNOSIS — E538 Deficiency of other specified B group vitamins: Secondary | ICD-10-CM | POA: Diagnosis not present

## 2021-09-29 DIAGNOSIS — E7800 Pure hypercholesterolemia, unspecified: Secondary | ICD-10-CM | POA: Diagnosis not present

## 2021-09-29 DIAGNOSIS — R7989 Other specified abnormal findings of blood chemistry: Secondary | ICD-10-CM

## 2021-09-29 DIAGNOSIS — R739 Hyperglycemia, unspecified: Secondary | ICD-10-CM | POA: Diagnosis not present

## 2021-09-29 DIAGNOSIS — D229 Melanocytic nevi, unspecified: Secondary | ICD-10-CM

## 2021-09-29 DIAGNOSIS — Z0001 Encounter for general adult medical examination with abnormal findings: Secondary | ICD-10-CM | POA: Diagnosis not present

## 2021-09-29 DIAGNOSIS — E559 Vitamin D deficiency, unspecified: Secondary | ICD-10-CM

## 2021-09-29 DIAGNOSIS — G8929 Other chronic pain: Secondary | ICD-10-CM

## 2021-09-29 MED ORDER — MELOXICAM 15 MG PO TABS
15.0000 mg | ORAL_TABLET | Freq: Every day | ORAL | 3 refills | Status: DC | PRN
Start: 2021-09-29 — End: 2022-01-13

## 2021-09-29 MED ORDER — CYCLOBENZAPRINE HCL 5 MG PO TABS: 5.0000 mg | ORAL_TABLET | Freq: Three times a day (TID) | ORAL | 3 refills | Status: DC | PRN

## 2021-09-29 NOTE — Patient Instructions (Addendum)
Please take OTC Vitamin D3 at 2000 units per day, indefinitely  Please take all new medication as prescribed - the flexeril as needd  Please continue all other medications as before, and refills have been done if requested.  Please have the pharmacy call with any other refills you may need.  Please continue your efforts at being more active, low cholesterol diet, and weight control.  You are otherwise up to date with prevention measures today.  Please keep your appointments with your specialists as you may have planned  Your forms will be completed shortly  Please make an Appointment to return for your 1 year visit, or sooner if needed, with Lab testing by Appointment as well, to be done about 3-5 days before at the [facility] (so this is for TWO appointments - please see the scheduling desk as you leave)

## 2021-09-29 NOTE — Progress Notes (Signed)
Patient ID: Carolyn Hancock, female   DOB: April 07, 1975, 46 y.o.   MRN: [id number]         Chief Complaint:: wellness exam and lo vit d, hld, lbp, mulltiple moles       HPI:  Carolyn Hancock is a 46 y.o. female here for wellness exam; declines covid booster, tdap, flu shot, colonoscopy, o/w up to date                        Also s/p low back surgury and now can funciton, LLE pain improved, still some intermitent numbness and worsneing pain to the lower back with sitting, can only sit with maximum time 20 min before forced to stand; driving to work is normally 40 min, and now being asked to return office inperson but drive is 40 min  Not taking Vit D.  Trying to follow lower chol diet but could do better.  Has ongoing ? Changing multiple moles    Wt Readings from Last 3 Encounters:  09/29/21 142 lb (64.4 kg)  06/02/20 120 lb 12.8 oz (54.8 kg)  05/21/20 134 lb 0.6 oz (60.8 kg)   BP Readings from Last 3 Encounters:  09/29/21 120/74  06/02/20 120/72  05/21/20 138/83   Immunization History  Administered Date(s) Administered   Influenza, High Dose Seasonal PF 11/14/2013   Influenza,inj,Quad PF,6+ Mos 11/19/2013, 11/20/2017   Influenza,inj,quad, With Preservative 12/16/2016, 12/11/2017   Influenza-Unspecified 11/20/2020   PFIZER(Purple Top)SARS-COV-2 Vaccination 05/05/2019, 05/26/2019, 12/05/2020   Td 04/15/1994, 04/16/2010   There are no preventive care reminders to display for this patient.     Past Medical History:  Diagnosis Date   Anemia, unspecified 06/07/2011   History of abnormal Pap smear 07/28/2011   1997; repeats normal since   Hyperlipidemia 06/03/2011   Pelvic floor dysfunction 05/04/2010   Resolved; 4/5 Kegel and no UIC at [facility]/pap on 67/13   Penicillin allergy 07/28/2011   rash   Pre-conception counseling 07/22/2011   Past Surgical History:  Procedure Laterality Date   TYMPANOSTOMY TUBE PLACEMENT     WISDOM TOOTH EXTRACTION      reports that she has never smoked. She  has never used smokeless tobacco. She reports that she does not drink alcohol and does not use drugs. family history includes Heart failure in her paternal grandmother; Hyperlipidemia in her father and mother; Hypertension in her father and mother. Allergies  Allergen Reactions   Penicillins Rash   No current outpatient medications on file prior to visit.   No current facility-administered medications on file prior to visit.        ROS:  All others reviewed and negative.  Objective        PE:  BP 120/74 (BP Location: Right Arm, Patient Position: Sitting, Cuff Size: Large)   Pulse 80   Temp 98.1 F (36.7 C) (Oral)   Ht '5\' 5"'$  (1.651 m)   Wt 142 lb (64.4 kg)   SpO2 98%   BMI 23.63 kg/m                 Constitutional: Pt appears in NAD               HENT: Head: NCAT.                Right Ear: External ear normal.                 Left Ear: External ear normal.  Eyes: . Pupils are equal, round, and reactive to light. Conjunctivae and EOM are normal               Nose: without d/c or deformity               Neck: Neck supple. Gross normal ROM               Cardiovascular: Normal rate and regular rhythm.                 Pulmonary/Chest: Effort normal and breath sounds without rales or wheezing.                Abd:  Soft, NT, ND, + BS, no organomegaly               Neurological: Pt is alert. At baseline orientation, motor grossly intact               Skin: Skin is warm. No rashes, no other new lesions, LE edema - none               Psychiatric: Pt behavior is normal without agitation   Micro: none  Cardiac tracings I have personally interpreted today:  none  Pertinent Radiological findings (summarize): none   Lab Results  Component Value Date   WBC 5.4 09/28/2021   HGB 15.0 09/28/2021   HCT 45.1 09/28/2021   PLT 211.0 09/28/2021   GLUCOSE 85 09/28/2021   CHOL 205 (H) 09/28/2021   TRIG 121.0 09/28/2021   HDL 55.20 09/28/2021   LDLDIRECT 151.1 04/16/2010    LDLCALC 125 (H) 09/28/2021   ALT 14 09/28/2021   AST 20 09/28/2021   NA 138 09/28/2021   K 3.9 09/28/2021   CL 102 09/28/2021   CREATININE 0.82 09/28/2021   BUN 12 09/28/2021   CO2 23 09/28/2021   TSH 1.99 09/28/2021   INR 0.92 03/28/2010   HGBA1C 5.4 09/28/2021   Assessment/Plan:  Carolyn Hancock is a 46 y.o. White or Caucasian [1] female with  has a past medical history of Anemia, unspecified (06/07/2011), History of abnormal Pap smear (07/28/2011), Hyperlipidemia (06/03/2011), Pelvic floor dysfunction (05/04/2010), Penicillin allergy (07/28/2011), and Pre-conception counseling (07/22/2011).  Low vitamin D level Last vitamin D Lab Results  Component Value Date   VD25OH 27.43 (L) 09/28/2021   Low to start oral replacement   Encounter for well adult exam with abnormal findings Age and sex appropriate education and counseling updated with regular exercise and diet Referrals for preventative services - declines colonoscopy Immunizations addressed - declines covid booster, tdap, flu shot Smoking counseling  - none needed Evidence for depression or other mood disorder - none significant Most recent labs reviewed. I have personally reviewed and have noted: 1) the patient's medical and social history 2) The patient's current medications and supplements 3) The patient's height, weight, and BMI have been recorded in the chart   Hyperlipidemia Lab Results  Component Value Date   LDLCALC 125 (H) 09/28/2021   Uncontrolled, goal ldl < 100, pt to continue current low chol diet as declines statin   Chronic low back pain With recent worsening chronically severe persistent with marked restrictions on sitting, standing leading to unable to return to office to work as drive is > 20 min.   Multiple atypical skin moles Also for dermatology referral for whole body exam, hopefully to start yearly  Followup: Return in about 1 year (around 09/30/2022).  Cathlean Cower, MD 10/02/2021 6:41 PM [facility]

## 2021-09-29 NOTE — Assessment & Plan Note (Signed)
Last vitamin D Lab Results  Component Value Date   VD25OH 27.43 (L) 09/28/2021   Low to start oral replacement

## 2021-10-02 ENCOUNTER — Encounter: Payer: Self-pay | Admitting: Internal Medicine

## 2021-10-02 DIAGNOSIS — D229 Melanocytic nevi, unspecified: Secondary | ICD-10-CM | POA: Insufficient documentation

## 2021-10-02 NOTE — Assessment & Plan Note (Signed)
With recent worsening chronically severe persistent with marked restrictions on sitting, standing leading to unable to return to office to work as drive is > 20 min.

## 2021-10-02 NOTE — Assessment & Plan Note (Signed)
Lab Results  Component Value Date   LDLCALC 125 (H) 09/28/2021   Uncontrolled, goal ldl < 100, pt to continue current low chol diet as declines statin

## 2021-10-02 NOTE — Assessment & Plan Note (Signed)
Age and sex appropriate education and counseling updated with regular exercise and diet Referrals for preventative services - declines colonoscopy Immunizations addressed - declines covid booster, tdap, flu shot Smoking counseling  - none needed Evidence for depression or other mood disorder - none significant Most recent labs reviewed. I have personally reviewed and have noted: 1) the patient's medical and social history 2) The patient's current medications and supplements 3) The patient's height, weight, and BMI have been recorded in the chart

## 2021-10-02 NOTE — Assessment & Plan Note (Signed)
Also for dermatology referral for whole body exam, hopefully to start yearly

## 2021-10-12 ENCOUNTER — Telehealth: Payer: Self-pay

## 2021-10-12 NOTE — Telephone Encounter (Signed)
Pt calling in checking the status of completion of paperwork.  One is a Biometric that was going to be faxed from her employer and the other is an accomodation letter stating that there is a time limitation on Driving, walking, standing and sitting.  Please advise

## 2021-10-15 NOTE — Telephone Encounter (Signed)
I have not ran across her paperwork as of yet but I will keep looking

## 2022-01-10 ENCOUNTER — Other Ambulatory Visit: Payer: Self-pay

## 2022-01-13 ENCOUNTER — Telehealth: Payer: Self-pay

## 2022-01-13 MED ORDER — MELOXICAM 15 MG PO TABS: 15.0000 mg | ORAL_TABLET | Freq: Every day | ORAL | 2 refills | Status: DC | PRN

## 2022-01-13 NOTE — Telephone Encounter (Signed)
Spoke with the pt and she has stated that due to insurance change she is needing her rx for meloxicam (MOBIC) 15 MG tablet  be changed to a 90 day supply in order to be covered and it has to be filled at [facility].

## 2022-01-13 NOTE — Telephone Encounter (Signed)
Ok this is done 

## 2022-09-21 ENCOUNTER — Other Ambulatory Visit: Payer: Self-pay | Admitting: Internal Medicine

## 2022-10-31 ENCOUNTER — Other Ambulatory Visit (INDEPENDENT_AMBULATORY_CARE_PROVIDER_SITE_OTHER): Payer: BC Managed Care – PPO

## 2022-10-31 DIAGNOSIS — E7800 Pure hypercholesterolemia, unspecified: Secondary | ICD-10-CM | POA: Diagnosis not present

## 2022-10-31 DIAGNOSIS — E538 Deficiency of other specified B group vitamins: Secondary | ICD-10-CM | POA: Diagnosis not present

## 2022-10-31 DIAGNOSIS — R739 Hyperglycemia, unspecified: Secondary | ICD-10-CM | POA: Diagnosis not present

## 2022-10-31 DIAGNOSIS — R7989 Other specified abnormal findings of blood chemistry: Secondary | ICD-10-CM | POA: Diagnosis not present

## 2022-10-31 LAB — BASIC METABOLIC PANEL
BUN: 18 mg/dL (ref 6–23)
CO2: 27 meq/L (ref 19–32)
Calcium: 9.1 mg/dL (ref 8.4–10.5)
Chloride: 105 meq/L (ref 96–112)
Creatinine, Ser: 0.93 mg/dL (ref 0.40–1.20)
GFR: 73.55 mL/min (ref >60.00)
Glucose, Bld: 83 mg/dL (ref 70–99)
Potassium: 4.5 meq/L (ref 3.5–5.1)
Sodium: 138 meq/L (ref 135–145)

## 2022-10-31 LAB — CBC WITH DIFFERENTIAL/PLATELET
Basophils Absolute: 0.1 10*3/uL (ref 0.0–0.1)
Basophils Relative: 1.8 % (ref 0.0–3.0)
Eosinophils Absolute: 0.2 10*3/uL (ref 0.0–0.7)
Eosinophils Relative: 4.3 % (ref 0.0–5.0)
HCT: 45.1 % (ref 36.0–46.0)
Hemoglobin: 14.9 g/dL (ref 12.0–15.0)
Lymphocytes Relative: 21 % (ref 12.0–46.0)
Lymphs Abs: 1.2 10*3/uL (ref 0.7–4.0)
MCHC: 33.2 g/dL (ref 30.0–36.0)
MCV: 93.9 fl (ref 78.0–100.0)
Monocytes Absolute: 0.5 10*3/uL (ref 0.1–1.0)
Monocytes Relative: 8.3 % (ref 3.0–12.0)
Neutro Abs: 3.6 10*3/uL (ref 1.4–7.7)
Neutrophils Relative %: 64.6 % (ref 43.0–77.0)
Platelets: 202 10*3/uL (ref 150.0–400.0)
RBC: 4.8 Mil/uL (ref 3.87–5.11)
RDW: 13.2 % (ref 11.5–15.5)
WBC: 5.6 10*3/uL (ref 4.0–10.5)

## 2022-10-31 LAB — VITAMIN D 25 HYDROXY (VIT D DEFICIENCY, FRACTURES): VITD: 42.3 ng/mL (ref 30.00–100.00)

## 2022-10-31 LAB — URINALYSIS, ROUTINE W REFLEX MICROSCOPIC
Bilirubin Urine: NEGATIVE
Ketones, ur: NEGATIVE
Leukocytes,Ua: NEGATIVE
Nitrite: NEGATIVE
Specific Gravity, Urine: 1.01 (ref 1.000–1.030)
Total Protein, Urine: NEGATIVE
Urine Glucose: NEGATIVE
Urobilinogen, UA: 0.2 (ref 0.0–1.0)
pH: 6 (ref 5.0–8.0)

## 2022-10-31 LAB — VITAMIN B12: Vitamin B-12: 442 pg/mL (ref 211–911)

## 2022-10-31 LAB — HEPATIC FUNCTION PANEL
ALT: 12 U/L (ref 0–35)
AST: 17 U/L (ref 0–37)
Albumin: 4.1 g/dL (ref 3.5–5.2)
Alkaline Phosphatase: 47 U/L (ref 39–117)
Bilirubin, Direct: 0.1 mg/dL (ref 0.0–0.3)
Total Bilirubin: 0.7 mg/dL (ref 0.2–1.2)
Total Protein: 6.7 g/dL (ref 6.0–8.3)

## 2022-10-31 LAB — LIPID PANEL
Cholesterol: 164 mg/dL (ref 0–200)
HDL: 53.7 mg/dL (ref >39.00)
LDL Cholesterol: 92 mg/dL (ref 0–99)
NonHDL: 110.35
Total CHOL/HDL Ratio: 3
Triglycerides: 90 mg/dL (ref 0.0–149.0)
VLDL: 18 mg/dL (ref 0.0–40.0)

## 2022-10-31 LAB — TSH: TSH: 0.98 u[IU]/mL (ref 0.35–5.50)

## 2022-10-31 LAB — HEMOGLOBIN A1C: Hgb A1c MFr Bld: 5.1 % (ref 4.6–6.5)

## 2022-11-02 ENCOUNTER — Ambulatory Visit (INDEPENDENT_AMBULATORY_CARE_PROVIDER_SITE_OTHER): Payer: BC Managed Care – PPO | Admitting: Internal Medicine

## 2022-11-02 ENCOUNTER — Encounter: Payer: Self-pay | Admitting: Internal Medicine

## 2022-11-02 VITALS — BP 118/70 | HR 73 | Temp 98.8°F | Ht 65.0 in | Wt 126.0 lb

## 2022-11-02 DIAGNOSIS — R739 Hyperglycemia, unspecified: Secondary | ICD-10-CM | POA: Diagnosis not present

## 2022-11-02 DIAGNOSIS — R7989 Other specified abnormal findings of blood chemistry: Secondary | ICD-10-CM | POA: Diagnosis not present

## 2022-11-02 DIAGNOSIS — Z1211 Encounter for screening for malignant neoplasm of colon: Secondary | ICD-10-CM

## 2022-11-02 DIAGNOSIS — Z0000 Encounter for general adult medical examination without abnormal findings: Secondary | ICD-10-CM | POA: Diagnosis not present

## 2022-11-02 DIAGNOSIS — M5442 Lumbago with sciatica, left side: Secondary | ICD-10-CM

## 2022-11-02 DIAGNOSIS — M5416 Radiculopathy, lumbar region: Secondary | ICD-10-CM | POA: Insufficient documentation

## 2022-11-02 DIAGNOSIS — E538 Deficiency of other specified B group vitamins: Secondary | ICD-10-CM | POA: Diagnosis not present

## 2022-11-02 DIAGNOSIS — G8929 Other chronic pain: Secondary | ICD-10-CM | POA: Diagnosis not present

## 2022-11-02 DIAGNOSIS — E559 Vitamin D deficiency, unspecified: Secondary | ICD-10-CM

## 2022-11-02 DIAGNOSIS — R296 Repeated falls: Secondary | ICD-10-CM | POA: Insufficient documentation

## 2022-11-02 DIAGNOSIS — E7800 Pure hypercholesterolemia, unspecified: Secondary | ICD-10-CM

## 2022-11-02 DIAGNOSIS — Z0001 Encounter for general adult medical examination with abnormal findings: Secondary | ICD-10-CM

## 2022-11-02 MED ORDER — MELOXICAM 15 MG PO TABS
15.0000 mg | ORAL_TABLET | Freq: Every day | ORAL | 3 refills | Status: DC | PRN
Start: 2022-11-02 — End: 2023-10-11

## 2022-11-02 NOTE — Progress Notes (Signed)
Chief Complaint:: wellness exam and low back pain. Hld, low vit d       HPI:  Carolyn Hancock is a 47 y.o. female here for wellness exam; plans to see GYN soon , decliens covid booster, for tdap at pharmacy, declines colonoscopy but for cologuard, o/w up to date                        Also fell x 2 a few months ago, better after changed shoes.  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  Pt also with worsening 3 mo acute on chronic mod to severe recurring LBP without bowel or bladder change, fever, wt loss, but has had mild to mod LLE numbness and weakness with pain to the buttocks as well. Has fallen with walking twice in the past month.  Has not missed work but having more difficulty with sitting for longer periods of time.  Last MRI 2019 with L5 disc surgury.     Wt Readings from Last 3 Encounters:  11/02/22 126 lb (57.2 kg)  09/29/21 142 lb (64.4 kg)  06/02/20 120 lb 12.8 oz (54.8 kg)   BP Readings from Last 3 Encounters:  11/02/22 118/70  09/29/21 120/74  06/02/20 120/72   Immunization History  Administered Date(s) Administered   Influenza Inj Mdck Quad Pf 11/20/2020   Influenza, High Dose Seasonal PF 11/14/2013   Influenza,inj,Quad PF,6+ Mos 11/19/2013, 11/20/2017   Influenza,inj,quad, With Preservative 12/16/2016, 12/11/2017   Influenza-Unspecified 11/20/2020   PFIZER(Purple Top)SARS-COV-2 Vaccination 05/05/2019, 05/26/2019, 12/05/2020   Td 04/15/1994, 04/16/2010   Health Maintenance Due  Topic Date Due   DTaP/Tdap/Td (3 - Tdap) 04/15/2020   Cervical Cancer Screening (HPV/Pap Cotest)  12/27/2020   Colonoscopy  Never done   COVID-19 Vaccine (4 - 2023-24 season) 10/16/2022      Past Medical History:  Diagnosis Date   Anemia, unspecified 06/07/2011   History of abnormal Pap smear 07/28/2011   1997; repeats normal since   Hyperlipidemia 06/03/2011   Pelvic floor dysfunction 05/04/2010   Resolved; 4/5 Kegel and no UIC at [facility]ex/pap on 67/13   Penicillin allergy  07/28/2011   rash   Pre-conception counseling 07/22/2011   Past Surgical History:  Procedure Laterality Date   TYMPANOSTOMY TUBE PLACEMENT     WISDOM TOOTH EXTRACTION      reports that she has never smoked. She has never used smokeless tobacco. She reports that she does not drink alcohol and does not use drugs. family history includes Heart failure in her paternal grandmother; Hyperlipidemia in her father and mother; Hypertension in her father and mother. Allergies  Allergen Reactions   Penicillins Rash   Current Outpatient Medications on File Prior to Visit  Medication Sig Dispense Refill   cyclobenzaprine (FLEXERIL) 5 MG tablet Take 1 tablet (5 mg total) by mouth 3 (three) times daily as needed. 60 tablet 3   No current facility-administered medications on file prior to visit.        ROS:  All others reviewed and negative.  Objective        PE:  BP 118/70 (BP Location: Right Arm, Patient Position: Sitting, Cuff Size: Normal)   Pulse 73   Temp 98.8 F (37.1 C) (Oral)   Ht 5\' 5"  (1.651 m)   Wt 126 lb (57.2 kg)   LMP 10/28/2022 (Exact Date)   SpO2 99%   BMI 20.97 kg/m  Constitutional: Pt appears in NAD               HENT: Head: NCAT.                Right Ear: External ear normal.                 Left Ear: External ear normal.                Eyes: . Pupils are equal, round, and reactive to light. Conjunctivae and EOM are normal               Nose: without d/c or deformity               Neck: Neck supple. Gross normal ROM               Cardiovascular: Normal rate and regular rhythm.                 Pulmonary/Chest: Effort normal and breath sounds without rales or wheezing.                Abd:  Soft, NT, ND, + BS, no organomegaly               Neurological: Pt is alert. At baseline orientation, motor 4+/5 LLE               Skin: Skin is warm. No rashes, no other new lesions, LE edema - none               Psychiatric: Pt behavior is normal without agitation    Micro: none  Cardiac tracings I have personally interpreted today:  none  Pertinent Radiological findings (summarize): none   Lab Results  Component Value Date   WBC 5.6 10/31/2022   HGB 14.9 10/31/2022   HCT 45.1 10/31/2022   PLT 202.0 10/31/2022   GLUCOSE 83 10/31/2022   CHOL 164 10/31/2022   TRIG 90.0 10/31/2022   HDL 53.70 10/31/2022   LDLDIRECT 151.1 04/16/2010   LDLCALC 92 10/31/2022   ALT 12 10/31/2022   AST 17 10/31/2022   NA 138 10/31/2022   K 4.5 10/31/2022   CL 105 10/31/2022   CREATININE 0.93 10/31/2022   BUN 18 10/31/2022   CO2 27 10/31/2022   TSH 0.98 10/31/2022   INR 0.92 03/28/2010   HGBA1C 5.1 10/31/2022   Assessment/Plan:  Carolyn Hancock is a 48 y.o. White or Caucasian [1] female with  has a past medical history of Anemia, unspecified (06/07/2011), History of abnormal Pap smear (07/28/2011), Hyperlipidemia (06/03/2011), Pelvic floor dysfunction (05/04/2010), Penicillin allergy (07/28/2011), and Pre-conception counseling (07/22/2011).  Encounter for well adult exam with abnormal findings Age and sex appropriate education and counseling updated with regular exercise and diet Referrals for preventative services - plans to see GYN soon, for cologuard Immunizations addressed - declines covid booster, for tdap at pharmacy,  Smoking counseling  - none needed Evidence for depression or other mood disorder - none significant Most recent labs reviewed. I have personally reviewed and have noted: 1) the patient's medical and social history 2) The patient's current medications and supplements 3) The patient's height, weight, and BMI have been recorded in the chart   Chronic low back pain With acute on chronic worsening with recent falls and LLE pain, numbness and intermittent weakness - for MRI LS spine, may need f/u NS as well  Hyperlipidemia Lab Results  Component Value Date   LDLCALC 92 10/31/2022  Stable, pt to continue current statin  - diet, wt  control  Low vitamin D level Last vitamin D Lab Results  Component Value Date   VD25OH 42.30 10/31/2022   Stable, cont oral replacement  Followup: Return in about 1 year (around 11/02/2023).  Oliver Barre, MD 11/02/2022 8:06 PM [facility]

## 2022-11-02 NOTE — Assessment & Plan Note (Signed)
Last vitamin D Lab Results  Component Value Date   VD25OH 42.30 10/31/2022   Stable, cont oral replacement

## 2022-11-02 NOTE — Assessment & Plan Note (Signed)
With acute on chronic worsening with recent falls and LLE pain, numbness and intermittent weakness - for MRI LS spine, may need f/u NS as well

## 2022-11-02 NOTE — Assessment & Plan Note (Signed)
Lab Results  Component Value Date   LDLCALC 92 10/31/2022   Stable, pt to continue current statin  - diet, wt control

## 2022-11-02 NOTE — Assessment & Plan Note (Signed)
Age and sex appropriate education and counseling updated with regular exercise and diet Referrals for preventative services - plans to see GYN soon, for cologuard Immunizations addressed - declines covid booster, for tdap at pharmacy,  Smoking counseling  - none needed Evidence for depression or other mood disorder - none significant Most recent labs reviewed. I have personally reviewed and have noted: 1) the patient's medical and social history 2) The patient's current medications and supplements 3) The patient's height, weight, and BMI have been recorded in the chart

## 2022-11-02 NOTE — Patient Instructions (Signed)
Your health form will be filled out  Please continue all other medications as before, including the mobic for pain.  Please have the pharmacy call with any other refills you may need.  Please continue your efforts at being more active, low cholesterol diet, and weight control.  You are otherwise up to date with prevention measures today.  Please keep your appointments with your specialists as you may have planned  You will be contacted regarding the referral for: MRI for the lower back, and the Cologuard  Your lab work was otherwise very good  Please make an Appointment to return for your 1 year visit, or sooner if needed, with Lab testing by Appointment as well, to be done about 3-5 days before at the FIRST FLOOR Lab (so this is for TWO appointments - please see the scheduling desk as you leave)

## 2022-11-13 LAB — COLOGUARD: COLOGUARD: NEGATIVE

## 2022-11-23 ENCOUNTER — Ambulatory Visit
Admission: RE | Admit: 2022-11-23 | Discharge: 2022-11-23 | Disposition: A | Discharge status: Home / Self Care | Payer: BC Managed Care – PPO | Source: Ambulatory Visit | Attending: Internal Medicine | Admitting: Internal Medicine

## 2022-11-23 DIAGNOSIS — R296 Repeated falls: Secondary | ICD-10-CM

## 2022-11-23 DIAGNOSIS — M5416 Radiculopathy, lumbar region: Secondary | ICD-10-CM

## 2022-12-15 ENCOUNTER — Other Ambulatory Visit: Payer: Self-pay | Admitting: Internal Medicine

## 2022-12-15 DIAGNOSIS — G8929 Other chronic pain: Secondary | ICD-10-CM

## 2023-09-22 ENCOUNTER — Telehealth: Payer: Self-pay

## 2023-09-22 NOTE — Telephone Encounter (Signed)
 Copied from CRM [id number]. Topic: General - Other >> Sep 22, 2023  2:14 PM [place] E wrote: Reason for CRM: Got patient scheduled for her physical on Sept. 19, but she will need this paperwork into her employer by Sept. 22, patient questioning if the paperwork could be completed at the time of visit so that she will be able to hand this into her employer the following Monday. Callback number [phone].

## 2023-09-27 NOTE — Telephone Encounter (Signed)
 Called and spoke with patient to confirm what type of paperwork this was. This is in regards to work accomodation. Patient informed me that she would be dropping off the form soon.

## 2023-10-10 ENCOUNTER — Other Ambulatory Visit: Payer: Self-pay | Admitting: Internal Medicine

## 2023-10-11 NOTE — Telephone Encounter (Signed)
 Patient dropped off document Medical Accomodation, to be filled out by provider. Patient requested to send it back via Fax within 7-days. Document is located in providers tray at front office.Please advise at [facility] [phone]   Patient also attached last year's form for reference.

## 2023-10-18 NOTE — Telephone Encounter (Signed)
 Called and informed patient of form completion. Have also faxed off with confirmation, and will be placing up front for patient pick up

## 2023-11-03 ENCOUNTER — Ambulatory Visit: Payer: Self-pay | Admitting: Internal Medicine

## 2023-11-03 ENCOUNTER — Ambulatory Visit: Admitting: Internal Medicine

## 2023-11-03 ENCOUNTER — Encounter: Payer: Self-pay | Admitting: Internal Medicine

## 2023-11-03 VITALS — BP 132/90 | HR 76 | Temp 98.1°F | Ht 65.0 in | Wt 142.8 lb

## 2023-11-03 DIAGNOSIS — R739 Hyperglycemia, unspecified: Secondary | ICD-10-CM | POA: Diagnosis not present

## 2023-11-03 DIAGNOSIS — E559 Vitamin D deficiency, unspecified: Secondary | ICD-10-CM | POA: Diagnosis not present

## 2023-11-03 DIAGNOSIS — Z0000 Encounter for general adult medical examination without abnormal findings: Secondary | ICD-10-CM | POA: Diagnosis not present

## 2023-11-03 DIAGNOSIS — E538 Deficiency of other specified B group vitamins: Secondary | ICD-10-CM | POA: Diagnosis not present

## 2023-11-03 DIAGNOSIS — R7989 Other specified abnormal findings of blood chemistry: Secondary | ICD-10-CM

## 2023-11-03 DIAGNOSIS — Z23 Encounter for immunization: Secondary | ICD-10-CM

## 2023-11-03 DIAGNOSIS — M5442 Lumbago with sciatica, left side: Secondary | ICD-10-CM

## 2023-11-03 DIAGNOSIS — E7800 Pure hypercholesterolemia, unspecified: Secondary | ICD-10-CM | POA: Diagnosis not present

## 2023-11-03 DIAGNOSIS — G8929 Other chronic pain: Secondary | ICD-10-CM

## 2023-11-03 LAB — URINALYSIS, ROUTINE W REFLEX MICROSCOPIC
Bilirubin Urine: NEGATIVE
Hgb urine dipstick: NEGATIVE
Ketones, ur: NEGATIVE
Nitrite: NEGATIVE
Specific Gravity, Urine: 1.01 (ref 1.000–1.030)
Total Protein, Urine: NEGATIVE
Urine Glucose: NEGATIVE
Urobilinogen, UA: 0.2 (ref 0.0–1.0)
pH: 7 (ref 5.0–8.0)

## 2023-11-03 LAB — LIPID PANEL
Cholesterol: 216 mg/dL — ABNORMAL HIGH (ref 0–200)
HDL: 59.4 mg/dL (ref >39.00)
LDL Cholesterol: 128 mg/dL — ABNORMAL HIGH (ref 0–99)
NonHDL: 156.13
Total CHOL/HDL Ratio: 4
Triglycerides: 143 mg/dL (ref 0.0–149.0)
VLDL: 28.6 mg/dL (ref 0.0–40.0)

## 2023-11-03 LAB — HEPATIC FUNCTION PANEL
ALT: 23 U/L (ref 0–35)
AST: 25 U/L (ref 0–37)
Albumin: 4.8 g/dL (ref 3.5–5.2)
Alkaline Phosphatase: 65 U/L (ref 39–117)
Bilirubin, Direct: 0.1 mg/dL (ref 0.0–0.3)
Total Bilirubin: 0.6 mg/dL (ref 0.2–1.2)
Total Protein: 7.7 g/dL (ref 6.0–8.3)

## 2023-11-03 LAB — BASIC METABOLIC PANEL WITH GFR
BUN: 13 mg/dL (ref 6–23)
CO2: 28 meq/L (ref 19–32)
Calcium: 9.4 mg/dL (ref 8.4–10.5)
Chloride: 102 meq/L (ref 96–112)
Creatinine, Ser: 0.83 mg/dL (ref 0.40–1.20)
GFR: 83.72 mL/min (ref >60.00)
Glucose, Bld: 89 mg/dL (ref 70–99)
Potassium: 4.2 meq/L (ref 3.5–5.1)
Sodium: 137 meq/L (ref 135–145)

## 2023-11-03 LAB — CBC WITH DIFFERENTIAL/PLATELET
Basophils Absolute: 0.1 K/uL (ref 0.0–0.1)
Basophils Relative: 1.2 % (ref 0.0–3.0)
Eosinophils Absolute: 0.1 K/uL (ref 0.0–0.7)
Eosinophils Relative: 1.3 % (ref 0.0–5.0)
HCT: 48.1 % — ABNORMAL HIGH (ref 36.0–46.0)
Hemoglobin: 15.9 g/dL — ABNORMAL HIGH (ref 12.0–15.0)
Lymphocytes Relative: 15.9 % (ref 12.0–46.0)
Lymphs Abs: 1.1 K/uL (ref 0.7–4.0)
MCHC: 33.1 g/dL (ref 30.0–36.0)
MCV: 92.2 fl (ref 78.0–100.0)
Monocytes Absolute: 0.4 K/uL (ref 0.1–1.0)
Monocytes Relative: 6.2 % (ref 3.0–12.0)
Neutro Abs: 5.1 K/uL (ref 1.4–7.7)
Neutrophils Relative %: 75.4 % (ref 43.0–77.0)
Platelets: 248 K/uL (ref 150.0–400.0)
RBC: 5.21 Mil/uL — ABNORMAL HIGH (ref 3.87–5.11)
RDW: 13.3 % (ref 11.5–15.5)
WBC: 6.8 K/uL (ref 4.0–10.5)

## 2023-11-03 LAB — HEMOGLOBIN A1C: Hgb A1c MFr Bld: 5.5 % (ref 4.6–6.5)

## 2023-11-03 LAB — TSH: TSH: 1.06 u[IU]/mL (ref 0.35–5.50)

## 2023-11-03 LAB — VITAMIN D 25 HYDROXY (VIT D DEFICIENCY, FRACTURES): VITD: 36.3 ng/mL (ref 30.00–100.00)

## 2023-11-03 LAB — VITAMIN B12: Vitamin B-12: 433 pg/mL (ref 211–911)

## 2023-11-03 MED ORDER — CYCLOBENZAPRINE HCL 5 MG PO TABS: 5.0000 mg | ORAL_TABLET | Freq: Three times a day (TID) | ORAL | 5 refills | Status: AC | PRN

## 2023-11-03 NOTE — Progress Notes (Signed)
 The test results show that your current treatment is OK, as the tests are stable, except the LDL cholesterol is mildly high,   Please continue the same plan and lower cholesterol diet.  There is no other need for change of treatment or further evaluation based on these results, at this time.  thanks

## 2023-11-03 NOTE — Patient Instructions (Signed)
 You had the flu shot today  Please have your Tdap tetanus shot at the pharmacy  Please continue all other medications as before, and refills have been done if requested.  Please have the pharmacy call with any other refills you may need.  Please continue your efforts at being more active, low cholesterol diet, and weight control.  You are otherwise up to date with prevention measures today.  Please keep your appointments with your specialists as you may have planned - surgury  Please go to the LAB at the blood drawing area for the tests to be done  You will be contacted by phone if any changes need to be made immediately.  Otherwise, you will receive a letter about your results with an explanation, but please check with MyChart first.  Please make an Appointment to return for your 1 year visit, or sooner if needed

## 2023-11-03 NOTE — Progress Notes (Signed)
 Patient ID: Carolyn Hancock, female   DOB: 1975/12/21, 49 y.o.   MRN: [id number]         Chief Complaint:: wellness exam and low vit d, chronic LBP, hld       HPI:  Carolyn Hancock is a 48 y.o. female here for wellness exam, due for Gyn next jan with mammogram, had negative cologuard 2024, for tdap at pharmacy o/w up to date; for flu shot today                        Also Pt continues to have recurring LBP without change in severity, bowel or bladder change, fever, wt loss,  worsening LE pain/numbness/weakness, gait change or falls,  but is chronic midline with left sciatica .  Pt denies chest pain, increased sob or doe, wheezing, orthopnea, PND, increased LE swelling, palpitations, dizziness or syncope.   Pt denies polydipsia, polyuria, or new focal neuro s/s.    Pt denies fever, wt loss, night sweats, loss of appetite, or other constitutional symptoms    Wt Readings from Last 3 Encounters:  11/03/23 142 lb 12.8 oz (64.8 kg)  11/02/22 126 lb (57.2 kg)  09/29/21 142 lb (64.4 kg)   BP Readings from Last 3 Encounters:  11/03/23 (!) 132/90  11/02/22 118/70  09/29/21 120/74   Immunization History  Administered Date(s) Administered   INFLUENZA, HIGH DOSE SEASONAL PF 11/14/2013   Influenza Inj Mdck Quad Pf 11/20/2020   Influenza, Seasonal, Injecte, Preservative Fre 11/03/2023   Influenza,inj,Quad PF,6+ Mos 11/19/2013, 11/20/2017   Influenza,inj,quad, With Preservative 12/16/2016, 12/11/2017   Influenza-Unspecified 11/20/2020   PFIZER(Purple Top)SARS-COV-2 Vaccination 05/05/2019, 05/26/2019, 12/05/2020   Td 04/15/1994, 04/16/2010   Health Maintenance Due  Topic Date Due   DTaP/Tdap/Td (3 - Tdap) 04/15/2020   Cervical Cancer Screening (HPV/Pap Cotest)  12/27/2020   Colonoscopy  Never done   Mammogram  11/18/2021      Past Medical History:  Diagnosis Date   Allergy    Penicillin, Seasonal   Anemia, unspecified 06/07/2011   History of abnormal Pap smear 07/28/2011   1997; repeats  normal since   Hyperlipidemia 06/03/2011   Pelvic floor dysfunction 05/04/2010   Resolved; 4/5 Kegel and no UIC at [facility]/pap on 67/13   Penicillin allergy 07/28/2011   rash   Pre-conception counseling 07/22/2011   Past Surgical History:  Procedure Laterality Date   SPINE SURGERY  06/12/2020   TYMPANOSTOMY TUBE PLACEMENT     WISDOM TOOTH EXTRACTION      reports that she has never smoked. She has never used smokeless tobacco. She reports that she does not drink alcohol and does not use drugs. family history includes Arthritis in her mother; Diabetes in her father; Heart failure in her paternal grandmother; Hyperlipidemia in her father and mother; Hypertension in her father and mother. Allergies  Allergen Reactions   Penicillins Rash   Current Outpatient Medications on File Prior to Visit  Medication Sig Dispense Refill   meloxicam  (MOBIC ) 15 MG tablet TAKE 1 TABLET DAILY AS NEEDED FOR PAIN 90 tablet 0   No current facility-administered medications on file prior to visit.        ROS:  All others reviewed and negative.  Objective        PE:  BP (!) 132/90   Pulse 76   Temp 98.1 F (36.7 C)   Ht 5' 5 (1.651 m)   Wt 142 lb 12.8 oz (64.8 kg)   SpO2 99%  BMI 23.76 kg/m                 Constitutional: Pt appears in NAD               HENT: Head: NCAT.                Right Ear: External ear normal.                 Left Ear: External ear normal.                Eyes: . Pupils are equal, round, and reactive to light. Conjunctivae and EOM are normal               Nose: without d/c or deformity               Neck: Neck supple. Gross normal ROM               Cardiovascular: Normal rate and regular rhythm.                 Pulmonary/Chest: Effort normal and breath sounds without rales or wheezing.                Abd:  Soft, NT, ND, + BS, no organomegaly               Neurological: Pt is alert. At baseline orientation, motor grossly intact               Skin: Skin is warm. No rashes, no  other new lesions, LE edema - none               Psychiatric: Pt behavior is normal without agitation   Micro: none  Cardiac tracings I have personally interpreted today:  none  Pertinent Radiological findings (summarize): none   Lab Results  Component Value Date   WBC 6.8 11/03/2023   HGB 15.9 (H) 11/03/2023   HCT 48.1 (H) 11/03/2023   PLT 248.0 11/03/2023   GLUCOSE 89 11/03/2023   CHOL 216 (H) 11/03/2023   TRIG 143.0 11/03/2023   HDL 59.40 11/03/2023   LDLDIRECT 151.1 04/16/2010   LDLCALC 128 (H) 11/03/2023   ALT 23 11/03/2023   AST 25 11/03/2023   NA 137 11/03/2023   K 4.2 11/03/2023   CL 102 11/03/2023   CREATININE 0.83 11/03/2023   BUN 13 11/03/2023   CO2 28 11/03/2023   TSH 1.06 11/03/2023   INR 0.92 03/28/2010   HGBA1C 5.5 11/03/2023   Assessment/Plan:  [place] Severs is a 48 y.o. White or Caucasian [1] female with  has a past medical history of Allergy, Anemia, unspecified (06/07/2011), History of abnormal Pap smear (07/28/2011), Hyperlipidemia (06/03/2011), Pelvic floor dysfunction (05/04/2010), Penicillin allergy (07/28/2011), and Pre-conception counseling (07/22/2011).  Preventative health care Age and sex appropriate education and counseling updated with regular exercise and diet Referrals for preventative services - for gyn appt next jan with mammogram,  Immunizations addressed - for tdap at pharmacy, for flu shot today Smoking counseling  - none needed Evidence for depression or other mood disorder - none significant Most recent labs reviewed. I have personally reviewed and have noted: 1) the patient's medical and social history 2) The patient's current medications and supplements 3) The patient's height, weight, and BMI have been recorded in the chart   Low vitamin D  level Last vitamin D  Lab Results  Component Value Date   VD25OH 36.30 11/03/2023   Low, to start oral replacement  Hyperlipidemia Lab Results  Component Value Date   LDLCALC  128 (H) 11/03/2023   Uncontrolled, for lower chol diet, declines statin, for lower chol diet  Chronic low back pain Chronic stable,  to f/u any worsening symptoms or concerns  Followup: Return in about 1 year (around 11/02/2024).  Lynwood Rush, MD 11/04/2023 6:03 PM [facility]

## 2023-11-04 ENCOUNTER — Encounter: Payer: Self-pay | Admitting: Internal Medicine

## 2023-11-04 NOTE — Assessment & Plan Note (Addendum)
 Age and sex appropriate education and counseling updated with regular exercise and diet Referrals for preventative services - for gyn appt next jan with mammogram,  Immunizations addressed - for tdap at pharmacy, for flu shot today Smoking counseling  - none needed Evidence for depression or other mood disorder - none significant Most recent labs reviewed. I have personally reviewed and have noted: 1) the patient's medical and social history 2) The patient's current medications and supplements 3) The patient's height, weight, and BMI have been recorded in the chart

## 2023-11-04 NOTE — Assessment & Plan Note (Signed)
 Lab Results  Component Value Date   LDLCALC 128 (H) 11/03/2023   Uncontrolled, for lower chol diet, declines statin, for lower chol diet

## 2023-11-04 NOTE — Assessment & Plan Note (Signed)
 Last vitamin D  Lab Results  Component Value Date   VD25OH 36.30 11/03/2023   Low, to start oral replacement

## 2023-11-04 NOTE — Assessment & Plan Note (Signed)
Chronic stable,  to f/u any worsening symptoms or concerns 

## 2024-01-08 ENCOUNTER — Other Ambulatory Visit: Payer: Self-pay

## 2024-01-08 ENCOUNTER — Other Ambulatory Visit: Payer: Self-pay | Admitting: Internal Medicine
